# Patient Record
Sex: Female | Born: 1970 | Race: Black or African American | Hispanic: No | Marital: Single | Smoking: Never smoker
Health system: Southern US, Community
[De-identification: ages and names within clinical notes are randomized; demographics above are authoritative.]

## PROBLEM LIST (undated history)

## (undated) DIAGNOSIS — E119 Type 2 diabetes mellitus without complications: Secondary | ICD-10-CM

## (undated) DIAGNOSIS — I10 Essential (primary) hypertension: Secondary | ICD-10-CM

## (undated) DIAGNOSIS — N6001 Solitary cyst of right breast: Secondary | ICD-10-CM

## (undated) DIAGNOSIS — N281 Cyst of kidney, acquired: Secondary | ICD-10-CM

## (undated) DIAGNOSIS — R928 Other abnormal and inconclusive findings on diagnostic imaging of breast: Secondary | ICD-10-CM

## (undated) DIAGNOSIS — D259 Leiomyoma of uterus, unspecified: Secondary | ICD-10-CM

## (undated) HISTORY — PX: OTHER SURGICAL HISTORY: SHX169

---

## 2003-07-15 NOTE — Op Note (Signed)
[facility]                                OPERATION REPORT   NAM ETHELEEN, VALTIERRA   E:   MR  [id number]                         DATE:            07/15/2003   #:   Lindley Magnus  [id number]                      PT. LOCATION:   #   DAVID Genevive Bi, M.D.   cc:    Alferd Patee, M.D.   PREOPERATIVE DIAGNOSIS:   Urinary frequency.   POSTOPERATIVE DIAGNOSIS:   Interstitial cystitis.   OPERATIVE PROCEDURES:      1. Cystoscopy.      2. Hydraulic overdistention of the bladder.      3. Urethral dilatation.   SURGEON:D. Genevive Bi, M.D.   FINDINGS:   Bladder capacity of 225 cc, petechial hemorrhages throughout the bladder on   second distention and urethral stenosis.   DESCRIPTION OF PROCEDURE:  The patient was placed in the dorsal lithotomy   position, the skin of the vagina was prepped with a soap suds solution and   the patient was draped in the appropriate manner.  The urethra was   stenotic, but I was able to get the 21-French cystoscope in.  On the second   distention, she had petechial hemorrhages throughout the bladder consistent   with interstitial cystitis.  There were no bladder tumors seen.  The   ureteral orifices were in their normal location and of a normal   configuration.  Her initial bladder capacity was 225 cc.  Progressively, I   performed hydraulic overdistention of her bladder to 500 cc.  The bladder   was drained.  I dilated her urethra with Sissy Hoff sounds to 32-French.   She tolerated the procedure well.   _________________________________   Alferd Patee, M.D.   ecc  D:  07/15/2003  T:  07/16/2003  1:43 P   [id number]

## 2003-07-15 NOTE — Op Note (Signed)
[facility]                                OPERATION REPORT   NAM DANIELS, Vlada   E:   MR  [id number]                         DATE:            07/15/2003   #:   SS  [id number]                      PT. LOCATION:   #   DAVID LUSTIG, M.D.   cc:    DAVID LUSTIG, M.D.   PREOPERATIVE DIAGNOSIS:   Urinary frequency.   POSTOPERATIVE DIAGNOSIS:   Interstitial cystitis.   OPERATIVE PROCEDURES:      1. Cystoscopy.      2. Hydraulic overdistention of the bladder.      3. Urethral dilatation.   SURGEON:D. Lustig, M.D.   FINDINGS:   Bladder capacity of 225 cc, petechial hemorrhages throughout the bladder on   second distention and urethral stenosis.   DESCRIPTION OF PROCEDURE:  The patient was placed in the dorsal lithotomy   position, the skin of the vagina was prepped with a soap suds solution and   the patient was draped in the appropriate manner.  The urethra was   stenotic, but I was able to get the 21-French cystoscope in.  On the second   distention, she had petechial hemorrhages throughout the bladder consistent   with interstitial cystitis.  There were no bladder tumors seen.  The   ureteral orifices were in their normal location and of a normal   configuration.  Her initial bladder capacity was 225 cc.  Progressively, I   performed hydraulic overdistention of her bladder to 500 cc.  The bladder   was drained.  I dilated her urethra with [place] sounds to 32-French.   She tolerated the procedure well.   _________________________________   DAVID LUSTIG, M.D.   ecc  D:  07/15/2003  T:  07/16/2003  1:43 P   [id number]

## 2009-09-07 MED ORDER — METOPROLOL TARTRATE 50 MG TAB
50 mg | ORAL_TABLET | Freq: Two times a day (BID) | ORAL | Status: AC
Start: 2009-09-07 — End: 2010-09-02

## 2009-09-07 NOTE — Telephone Encounter (Signed)
Advised pt Dr Margurite Auerbach is not in ofc and will only refill for 30 days, has not been seen since Jan and needs a follow up before any more refills

## 2009-09-08 NOTE — Telephone Encounter (Signed)
Called into pharmacy

## 2009-11-08 LAB — D-DIMER, QUANTITATIVE: D-Dimer, Quant: 0.56 ug/ml(FEU) — ABNORMAL HIGH (ref <0.55)

## 2009-11-08 LAB — METABOLIC PANEL, COMPREHENSIVE
A-G Ratio: 0.9 — ABNORMAL LOW (ref 1.2–3.5)
ALT (SGPT): 27 U/L — ABNORMAL LOW (ref 39–65)
AST (SGOT): 14 U/L — ABNORMAL LOW (ref 15–37)
Albumin: 3.9 g/dL (ref 3.5–5.0)
Alk. phosphatase: 115 U/L (ref 50–136)
Anion gap: 5 mmol/L — ABNORMAL LOW (ref 7–16)
BUN: 13 MG/DL (ref 7–18)
Bilirubin, total: 0.3 MG/DL (ref 0.2–1.1)
CO2: 31 MMOL/L (ref 21–32)
Calcium: 9.1 MG/DL (ref 8.4–10.4)
Chloride: 103 MMOL/L (ref 98–107)
Creatinine: 1.1 MG/DL — ABNORMAL HIGH (ref 0.6–1.0)
GFR est AA: >60 mL/min/{1.73_m2} (ref >60)
GFR est non-AA: 58 mL/min/{1.73_m2} — ABNORMAL LOW (ref >60)
Globulin: 4.5 g/dL — ABNORMAL HIGH (ref 2.3–3.5)
Glucose: 90 MG/DL (ref 65–100)
Potassium: 3.1 MMOL/L — ABNORMAL LOW (ref 3.5–5.1)
Protein, total: 8.4 g/dL — ABNORMAL HIGH (ref 6.3–8.2)
Sodium: 139 MMOL/L (ref 136–145)

## 2009-11-08 LAB — D DIMER: D DIMER: 0.56 ug/ml(FEU) — ABNORMAL HIGH (ref <0.55)

## 2009-11-08 LAB — CBC WITH AUTOMATED DIFF
ABS. BASOPHILS: 0 10*3/uL (ref 0.0–0.2)
ABS. EOSINOPHILS: 0.1 10*3/uL (ref 0.0–0.8)
ABS. IMM. GRANS.: 0 10*3/uL (ref 0.0–2.0)
ABS. LYMPHOCYTES: 1.9 10*3/uL (ref 0.5–4.6)
ABS. MONOCYTES: 0.5 10*3/uL (ref 0.1–1.3)
ABS. NEUTROPHILS: 3.1 10*3/uL (ref 1.7–8.2)
BASOPHILS: 0 % (ref 0.0–2.0)
EOSINOPHILS: 2 % (ref 0.5–7.8)
HCT: 41.7 % (ref 37.6–48.3)
HGB: 13.6 g/dL (ref 11.7–15.0)
IMMATURE GRANULOCYTES: 0.4 % (ref 0.0–2.0)
LYMPHOCYTES: 33 % (ref 13–44)
MCH: 29.6 PG (ref 26.1–32.9)
MCHC: 32.6 g/dL (ref 31.4–35.0)
MCV: 90.8 FL (ref 79.6–97.8)
MONOCYTES: 9 % (ref 4.0–12.0)
MPV: 10.6 FL — ABNORMAL LOW (ref 10.8–14.1)
NEUTROPHILS: 56 % (ref 43–78)
PLATELET: 364 10*3/uL (ref 140–440)
RBC: 4.59 M/uL (ref 3.86–5.18)
RDW: 13.4 % (ref 11.9–14.6)
WBC: 5.7 10*3/uL (ref 4.0–10.5)

## 2009-11-08 LAB — POC CARDIAC MARKERS
CK-MB: <1 ng/mL (ref 0.0–8.0)
Myoglobin: 42 ng/mL (ref 0–170)
Troponin-I: <0.05 ng/mL (ref 0.00–0.30)

## 2009-11-08 LAB — EKG, 12 LEAD, INITIAL
Atrial Rate: 71 {beats}/min
Calculated P Axis: 68 degrees
Calculated R Axis: 3 degrees
Calculated T Axis: 48 degrees
P-R Interval: 168 ms
Q-T Interval: 388 ms
QRS Duration: 88 ms
QTC Calculation (Bezet): 421 ms
Ventricular Rate: 71 {beats}/min

## 2009-11-08 LAB — LIPID PANEL
CHOL/HDL Ratio: 4
Cholesterol, total: 139 MG/DL (ref <200)
HDL Cholesterol: 35 MG/DL — ABNORMAL LOW (ref 40–60)
LDL, calculated: 93.6 MG/DL (ref <100)
Triglyceride: 52 MG/DL (ref 35–150)
VLDL, calculated: 10.4 MG/DL (ref 6.0–23.0)

## 2009-11-08 LAB — MAGNESIUM: Magnesium: 1.9 MG/DL (ref 1.8–2.4)

## 2009-11-08 MED ORDER — IBUPROFEN 800 MG TAB
800 mg | ORAL_TABLET | Freq: Three times a day (TID) | ORAL | Status: AC | PRN
Start: 2009-11-08 — End: 2009-11-11

## 2009-11-08 MED ADMIN — ioversol (OPTIRAY) 350 mg/mL contrast solution 100 mL: INTRAVENOUS | @ 07:00:00 | NDC 00019133311

## 2009-11-08 MED ADMIN — potassium chloride (K-DUR, KLOR-CON) SR tablet 20 mEq: ORAL | @ 07:00:00 | NDC 00245005889

## 2009-11-08 MED FILL — KLOR-CON M20 MEQ TABLET,EXTENDED RELEASE: 20 mEq | ORAL | Qty: 1

## 2009-11-08 NOTE — ED Provider Notes (Signed)
Patient is a 38 y.o. female presenting with chest pain. The history is provided by the patient. No language interpreter was used.   Chest Pain (Angina)   This is a new problem. The current episode started 1 to 2 hours ago. The problem has been gradually improving. Duration of episode(s) is 45 minutes. The problem occurs rarely. The pain is associated with normal activity.        Past Medical History   Diagnosis Date   ??? Hypertension    ??? Diabetes           Past Surgical History   Procedure Date   ??? Hx breast biopsy    ??? Hx other surgical      skin graft right arm   ??? Abdomen surgery proc unlisted            No family history on file.     History   Social History   ??? Marital Status: Married     Spouse Name: N/A     Number of Children: N/A   ??? Years of Education: N/A   Occupational History   ??? Not on file.   Social History Main Topics   ??? Tobacco Use: Never   ??? Alcohol Use: No   ??? Drug Use: No   ??? Sexually Active:    Other Topics Concern   ??? Not on file   Social History Narrative   ??? No narrative on file           ALLERGIES: Demerol      Review of Systems   Cardiovascular: Positive for chest pain.       Filed Vitals:    11/08/2009  1:22 AM 11/08/2009  1:30 AM 11/08/2009  1:45 AM 11/08/2009  2:00 AM   BP: 143/90 138/86 134/89 134/83   Pulse: 76 72 69 66   Temp: 98.2 ??F (36.8 ??C)      Resp: 14 34 11 15   Height: 5\' 7"  (1.702 m)      Weight: 165 lb (74.844 kg)      SpO2: 100% 99% 100% 98%              Physical Exam   Nursing note and vitals reviewed.  Constitutional: She is oriented to person, place, and time. She appears well-developed and well-nourished.   HENT:   Head: Normocephalic and atraumatic.   Right Ear: External ear normal.   Left Ear: External ear normal.   Nose: Nose normal.   Mouth/Throat: Oropharynx is clear and moist.   Eyes: Conjunctivae are normal. Pupils are equal, round, and reactive to light.   Neck: Normal range of motion. Neck supple.    Cardiovascular: Normal rate, regular rhythm and normal heart sounds.    Pulmonary/Chest: Effort normal and breath sounds normal. No respiratory distress. She has no wheezes. She has no rales.   Abdominal: Soft. Bowel sounds are normal.   Musculoskeletal: Normal range of motion. She exhibits no edema.   Neurological: She is alert and oriented to person, place, and time.   Skin: Skin is warm and dry.   Psychiatric: She has a normal mood and affect. Her behavior is normal.        MDM Coding   Reviewed: previous chart, nursing note and vitals  Interpretation: labs, ECG, x-ray and CT scan        Procedures

## 2009-11-08 NOTE — ED Notes (Signed)
I have reviewed discharge instructions with the patient.  The patient verbalized understanding. No prescriptions given. Patient discharged ambulatory.

## 2009-11-08 NOTE — ED Notes (Signed)
CTA chest neg

## 2015-04-12 ENCOUNTER — Ambulatory Visit: Admit: 2015-04-12 | Payer: PRIVATE HEALTH INSURANCE | Attending: Obstetrics & Gynecology | Primary: Family Medicine

## 2015-04-12 ENCOUNTER — Encounter: Attending: Obstetrics & Gynecology | Primary: Family Medicine

## 2015-04-12 DIAGNOSIS — D252 Subserosal leiomyoma of uterus: Secondary | ICD-10-CM

## 2015-04-12 NOTE — Progress Notes (Signed)
SUBJECTIVE: this a 44 year old female gravida 74 para 44 who presents with intermittent pelvic pain and heavy menstrual bleeding. She states that the pain is normally associated with her menstruation it is sharp. She gets relief with Naprosyn. Her bleeding lasts for only 3-4 days but it consists of clots and she uses 8-10 pads per day. Past medical history was reviewed.[01]   OBJECTIVE:   Vital signs are stable.  Is a well-developed well-nourished female in no apparent distress.  Abdomen was slightly distended with a palpable mass 2 fingers above the pelvic brim. It was slightly tender.  External genitalia normal.  Vaginal canal revealed no blood or discharge.  Cervix had no visible lesions.  Uterus was 12-14 weeks size and irregular suggestive of fibroids.  Adnexa unremarkable02]  ASSESSMENT:  Symptomatic uterine fibroids[03]  PLAN:  1.  Patient was given treatment options between laparoscopic hysterectomy and fibroid embolization. She wants to pursue fibroid embolization and therefore an MRI of the pelvis was ordered.  2. She will be given a referral to interventional radiology.[04]

## 2015-04-12 NOTE — Patient Instructions (Signed)
Laparoscopic Hysterectomy: Before Your Surgery  What is a laparoscopic hysterectomy?     A hysterectomy is surgery to take out the uterus. In some cases, the ovaries and fallopian tubes also are taken out at the same time.  The doctor makes one or more small cuts in the belly. These cuts are called incisions. They let the doctor insert tools to do the surgery. One of these tools is a tube with a light on it. It's called a laparoscope, or scope. The scope and the other tools allow the doctor to free the uterus. The doctor then removes the uterus through the small cuts.  In a total hysterectomy, the doctor takes out the uterus and the cervix. In a supracervical hysterectomy, only the uterus is taken out.  Most women go home in 1 to 2 days. You may need about 4 to 6 weeks to fully recover.  After the surgery, you will not have periods. You will not be able to get pregnant. If there is a chance that you will want to have a baby, talk to your doctor about other treatment options.  Your doctor may advise you to take hormone pills if your ovaries are removed. Your doctor will talk to you about the risks and benefits of hormones. He or she will also tell you how long to take them.  This surgery probably won't lower your interest in sex. In fact, some women enjoy sex more. This may be because they no longer have to worry about birth control or heavy bleeding.  Follow-up care is a key part of your treatment and safety. Be sure to make and go to all appointments, and call your doctor if you are having problems. It's also a good idea to know your test results and keep a list of the medicines you take.  What happens before surgery?  Surgery can be stressful. This information will help you understand what you can expect. And it will help you safely prepare for surgery.  Preparing for surgery  ?? Understand exactly what surgery is planned, along with the risks, benefits, and other options.   ?? Tell your doctors ALL the medicines, vitamins, supplements, and herbal remedies you take. Some of these can increase the risk of bleeding or interact with anesthesia.  ?? If you take blood thinners, such as warfarin (Coumadin), clopidogrel (Plavix), or aspirin, be sure to talk to your doctor. He or she will tell you if you should stop taking these medicines before your surgery. Make sure that you understand exactly what your doctor wants you to do.  ?? Your doctor will tell you which medicines to take or stop before your surgery. You may need to stop taking certain medicines a week or more before surgery. So talk to your doctor as soon as you can.  ?? If you have an advance directive, let your doctor know. It may include a living will and a durable power of attorney for health care. Bring a copy to the hospital. If you don't have one, you may want to prepare one. It lets your doctor and loved ones know your health care wishes. Doctors advise that everyone prepare these papers before any type of surgery or procedure.  What happens on the day of surgery?  ?? Follow the instructions exactly about when to stop eating and drinking. If you don't, your surgery may be canceled. If your doctor told you to take your medicines on the day of surgery, take them with only a  sip of water.  ?? Take a bath or shower before you come in for your surgery. Do not apply lotions, perfumes, deodorants, or nail polish.  ?? Do not shave the surgical site yourself.  ?? Take off all jewelry and piercings. And take out contact lenses, if you wear them.  At the hospital or surgery center  ?? Bring a picture ID.  ?? The area for surgery is often marked to make sure there are no errors.  ?? You will be kept comfortable and safe by your anesthesia provider. You will be asleep during the surgery.  ?? The surgery will take about 2 to 4 hours.  Going home  ?? Be sure you have someone to drive you home. Anesthesia and pain medicine  make it unsafe for you to drive.  ?? You will be given more specific instructions about recovering from your surgery. They will cover things like diet, wound care, follow-up care, driving, and getting back to your normal routine.  When should you call your doctor?  ?? You have questions or concerns.  ?? You don't understand how to prepare for your surgery.  ?? You become ill before the surgery (such as fever, flu, or a cold).  ?? You need to reschedule or have changed your mind about having the surgery.   Where can you learn more?   Go to [email or web address]  Enter D130 in the search box to learn more about "Laparoscopic Hysterectomy: Before Your Surgery."   ?? 2006-2016 [employer]. Care instructions adapted under license by [employer] (which disclaims liability or warranty for this information). This care instruction is for use with your licensed healthcare professional. If you have questions about a medical condition or this instruction, always ask your healthcare professional. [place] any warranty or liability for your use of this information.  Content Version: 10.8.513193; Current as of: July 12, 2014

## 2015-04-21 ENCOUNTER — Encounter

## 2015-04-22 ENCOUNTER — Inpatient Hospital Stay: Admit: 2015-04-22 | Payer: PRIVATE HEALTH INSURANCE | Attending: Obstetrics & Gynecology | Primary: Family Medicine

## 2015-04-22 DIAGNOSIS — N852 Hypertrophy of uterus: Secondary | ICD-10-CM

## 2015-04-22 LAB — CREATININE, POC
Creatinine, POC: 0.9 MG/DL (ref 0.6–1.3)
GFRAA, POC: >60 mL/min/{1.73_m2} (ref >60)
GFRNA, POC: >60 mL/min/{1.73_m2} (ref >60)

## 2015-04-22 MED ORDER — GADOPENTETATE DIMEGLUMINE 10 MMOL/20 ML (469.01 MG/ML) IV
10 mmol/20 mL (469.01 mg/mL) | Freq: Once | INTRAVENOUS | Status: AC
Start: 2015-04-22 — End: 2015-04-22
  Administered 2015-04-22: 22:00:00 via INTRAVENOUS

## 2015-04-22 MED FILL — MAGNEVIST 10 MMOL/20 ML (469.01 MG/ML) INTRAVENOUS SOLUTION: 10 mmol/20 mL (469.01 mg/mL) | INTRAVENOUS | Qty: 16

## 2015-04-25 ENCOUNTER — Inpatient Hospital Stay: Attending: Family Medicine | Primary: Family Medicine

## 2015-04-25 DIAGNOSIS — R928 Other abnormal and inconclusive findings on diagnostic imaging of breast: Secondary | ICD-10-CM

## 2015-05-04 NOTE — Telephone Encounter (Signed)
Patient called requesting MRI results and her next step for surgery. Please advise.

## 2015-05-05 ENCOUNTER — Ambulatory Visit
Admit: 2015-05-05 | Discharge: 2015-05-05 | Payer: PRIVATE HEALTH INSURANCE | Attending: Urology | Primary: Family Medicine

## 2015-05-05 ENCOUNTER — Encounter

## 2015-05-05 DIAGNOSIS — N281 Cyst of kidney, acquired: Secondary | ICD-10-CM

## 2015-05-05 LAB — AMB POC URINALYSIS DIP STICK AUTO W/O MICRO
Bilirubin (UA POC): NEGATIVE
Blood (UA POC): NEGATIVE
Glucose (UA POC): NEGATIVE
Ketones (UA POC): NEGATIVE
Leukocyte esterase (UA POC): NEGATIVE
Nitrites (UA POC): NEGATIVE
Protein (UA POC): NEGATIVE mg/dL
Specific gravity (UA POC): 1.02 (ref 1.001–1.035)
Urobilinogen (UA POC): 0.2 (ref 0.2–1)
pH (UA POC): 8 (ref 4.6–8.0)

## 2015-05-05 NOTE — Progress Notes (Signed)
Chief Complaint   Patient presents with   ??? Renal Cyst         HPI:         April Perry is a 44 y.o. female who was referred by Georgianne Fick, MD for evaluation of Renal cyst.      The patient came to see [place] for the first time on 05/05/15 traveling from the [place] to [place] for evaluation of a renal cyst apparently seen on MRI.  However, the only MRI in the EMR system was a pelvic MRI from early May 2016 which did not image the kidneys at all.  Although the person scheduling appointment stated that the MRI recently done at [facility] was somehow related to the cyst, the patient then stated that she was found to have a cyst while being evaluated in [place] and she did not have any films are records of that.      She states that around October 2015, she was being evaluated with an ultrasound for fibroids in half and to look up in the kidney area and mentioned a possible 7 cm right renal cyst.  Despite having no symptoms, and without being told that his any specific concerning abnormality, she was told that "it would probably have to be draining".  However, she then moved to [place], did not have access to any old records, could not supply old imaging studies, and scheduled an appointment with [place] to discuss her possible renal cyst.    She denies any hematuria or recurrent bladder or kidney infections.  She's not sure, but she may have been told once that she had a small kidney stone.  Years ago, after laparoscopy, she had urinary frequency and was told she had a "small bladder", and was put on Detrol.  Over time, with "bladder training" the medication was stopped.  However, in May 2016 she still has some complaints of urinary frequency but primarily exacerbated by diuretic for blood pressure control, and possible fibroids.          Past Medical History   Diagnosis Date   ??? Hypertension    ??? Diabetes ([place])    ??? Genital herpes    ??? Ovarian cyst    ??? Obesity    ??? Chronic pain       left arm, chest and back       Past Surgical History   Procedure Laterality Date   ??? Hx other surgical       skin graft right arm   ??? Pr abdomen surgery proc unlisted     ??? Hx breast biopsy     ??? Hx tubal ligation       bilateral        Family History   Problem Relation Age of Onset   ??? Hypertension Mother    ??? Thyroid Disease Mother    ??? Cancer Father      prostate   ??? Hypertension Maternal Grandmother    ??? Diabetes Maternal Grandmother    ??? Cancer Paternal Grandfather      lung       Current Outpatient Prescriptions   Medication Sig Dispense Refill   ??? METOPROLOL SUCCINATE PO Take  by mouth.     ??? hydrochlorothiazide (HYDRODIURIL) 25 mg tablet Take 25 mg by mouth daily.         History     Social History   ??? Marital Status: SINGLE     Spouse Name: N/A   ???  Number of Children: N/A   ??? Years of Education: N/A     Occupational History   ??? Not on file.     Social History Main Topics   ??? Smoking status: Never Smoker    ??? Smokeless tobacco: Never Used   ??? Alcohol Use: No   ??? Drug Use: No   ??? Sexual Activity: Not on file     Other Topics Concern   ??? Not on file     Social History Narrative        Allergies:  Allergies   Allergen Reactions   ??? Demerol [Meperidine] Rash   ??? Lisinopril Unable to Obtain         ROS:    Constitutional: Fever: No  Skin: Rash: No  HEENT: Hearing difficulty: No  Eyes: Blurred vision: No  Cardiovascular: Chest pain: No  Respiratory: Shortness of breath: No  Gastrointestinal: Nausea/vomiting: No  Musculoskeletal: Back pain: No  Neurological: Weakness: No  Psychological: Memory loss: No  Comments/additional findings:   All other systems reviewed and are negative      PE:    BP 130/80 mmHg   Ht 5\' 7"  (1.702 m)   Wt 169 lb (76.658 kg)   BMI 26.46 kg/m2  Constitutional: Well developed, well-nourished female in no acute distress.   CV:  No peripheral swelling noted  Respiratory: No respiratory distress or difficulties  Abdomen:  Soft and nontender. No masses.    GU Female:  No CVA tenderness.     Skin: No bruising or rashes.  No petechia.    Neuro/Psych:  Patient with appropriate affect.  Alert and oriented.            LAB:    Results for orders placed or performed in visit on 05/05/15   AMB POC URINALYSIS DIP STICK AUTO W/O MICRO   Result Value Ref Range    Color (UA POC) Yellow     Clarity (UA POC) Clear     Glucose (UA POC) Negative Negative    Bilirubin (UA POC) Negative Negative    Ketones (UA POC) Negative Negative    Specific gravity (UA POC) 1.020 1.001 - 1.035    Blood (UA POC) Negative Negative    pH (UA POC) 8.0 4.6 - 8.0    Protein (UA POC) Negative Negative mg/dL    Urobilinogen (UA POC) 0.2 mg/dL 0.2 - 1    Nitrites (UA POC) Negative Negative    Leukocyte esterase (UA POC) Negative Negative                IMP:      Encounter Diagnosis   Name Primary?   ??? Renal cyst Yes                 SUMMARY STATEMENT AND PLAN:    If she truly has a 7 cm renal cyst on the right, and it is benign-appearing, we would not do anything about that.  She is asymptomatic and the size of the cyst is irrelevant regarding the need for further investigation or drainage.    She will get an ultrasound in [place] to evaluate her kidneys, and if all we find his simple cysts, I would have her return as needed.  No specific follow-up would be needed for those.  If there is some concern, we may need further imaging such as CT or MRI.  She will call for the ultrasound when she is done and will return if there is an issue.  PLEASE NOTE:   This document has been produced using voice recognition software. Unrecognized errors in transcription may be present    Siri Cole, MD, F.A.C.S.

## 2015-05-06 ENCOUNTER — Encounter

## 2015-05-06 ENCOUNTER — Inpatient Hospital Stay: Admit: 2015-05-06 | Payer: Self-pay | Attending: Family Medicine | Primary: Family Medicine

## 2015-05-06 DIAGNOSIS — R928 Other abnormal and inconclusive findings on diagnostic imaging of breast: Secondary | ICD-10-CM

## 2015-05-09 ENCOUNTER — Inpatient Hospital Stay: Admit: 2015-05-09 | Payer: Self-pay | Attending: Urology | Primary: Family Medicine

## 2015-05-09 DIAGNOSIS — N281 Cyst of kidney, acquired: Secondary | ICD-10-CM

## 2015-05-10 ENCOUNTER — Encounter

## 2015-05-10 NOTE — Progress Notes (Signed)
Quick Note:        Please advise the patient that the ultrasound confirms a right renal cyst. They also saw what they thought was a tiny other cyst next to it, but it was too small to accurately assess. They recommended a follow-up study just to make sure everything looked okay over time. I would recommend a repeat ultrasound in a [place] facility in about 6 months and she should return to [facility] [place] after that. Because she came from [place] to [place], that Follow-up appointment can be scheduled either in our [place] or [place] office for her convenience if she desires.    ______

## 2015-05-11 NOTE — Telephone Encounter (Addendum)
Telephone call to patient, left a message for patient to return phone call  Fredric Dine Tamila Gaulin        ----- Message from Siri Cole, MD sent at 05/10/2015  7:29 AM EDT -----  Please advise the patient that the ultrasound confirms a right renal cyst.  They also saw what they thought was a tiny other cyst next to it, but it was too small to accurately assess.  They recommended a follow-up study just to make sure everything looked okay over time.  I would recommend a repeat ultrasound in a [place] facility in about 6 months and she should return to [facility] [place] after that.  Because she came from [place] to [place], that Follow-up appointment can be scheduled either in our [place] or [place] office for her convenience if she desires.

## 2015-05-12 NOTE — Telephone Encounter (Signed)
Patient returned phone call and ultrasound results were given to her. Patient stated shw would follow up with an appointment and renal US in six months.      Gilford Raid

## 2015-06-28 ENCOUNTER — Inpatient Hospital Stay
Admit: 2015-06-28 | Discharge: 2015-06-28 | Disposition: A | Discharge status: Home / Self Care | Payer: BLUE CROSS/BLUE SHIELD | Attending: Emergency Medicine

## 2015-06-28 DIAGNOSIS — R0789 Other chest pain: Secondary | ICD-10-CM

## 2015-06-28 LAB — METABOLIC PANEL, BASIC
Anion gap: 6 mmol/L (ref 3.0–18)
BUN/Creatinine ratio: 13 (ref 12–20)
BUN: 11 MG/DL (ref 7.0–18)
CO2: 29 mmol/L (ref 21–32)
Calcium: 8.6 MG/DL (ref 8.5–10.1)
Chloride: 106 mmol/L (ref 100–108)
Creatinine: 0.84 MG/DL (ref 0.6–1.3)
GFR est AA: >60 mL/min/{1.73_m2} (ref >60)
GFR est non-AA: >60 mL/min/{1.73_m2} (ref >60)
Glucose: 94 mg/dL (ref 74–99)
Potassium: 3.6 mmol/L (ref 3.5–5.5)
Sodium: 141 mmol/L (ref 136–145)

## 2015-06-28 LAB — CBC WITH AUTOMATED DIFF
ABS. BASOPHILS: 0 10*3/uL (ref 0.0–0.06)
ABS. EOSINOPHILS: 0.2 10*3/uL (ref 0.0–0.4)
ABS. LYMPHOCYTES: 1.5 10*3/uL (ref 0.9–3.6)
ABS. MONOCYTES: 0.5 10*3/uL (ref 0.05–1.2)
ABS. NEUTROPHILS: 3.1 10*3/uL (ref 1.8–8.0)
BASOPHILS: 0 % (ref 0–2)
EOSINOPHILS: 4 % (ref 0–5)
HCT: 38.8 % (ref 35.0–45.0)
HGB: 11.9 g/dL — ABNORMAL LOW (ref 12.0–16.0)
LYMPHOCYTES: 29 % (ref 21–52)
MCH: 25.3 PG (ref 24.0–34.0)
MCHC: 30.7 g/dL — ABNORMAL LOW (ref 31.0–37.0)
MCV: 82.6 FL (ref 74.0–97.0)
MONOCYTES: 9 % (ref 3–10)
MPV: 10.7 FL (ref 9.2–11.8)
NEUTROPHILS: 58 % (ref 40–73)
PLATELET COMMENTS: ADEQUATE
PLATELET: 434 10*3/uL — ABNORMAL HIGH (ref 135–420)
RBC: 4.7 M/uL (ref 4.20–5.30)
RDW: 25.1 % — ABNORMAL HIGH (ref 11.6–14.5)
WBC: 5.3 10*3/uL (ref 4.6–13.2)

## 2015-06-28 LAB — EKG, 12 LEAD, INITIAL
Atrial Rate: 74 {beats}/min
Calculated P Axis: 60 degrees
Calculated R Axis: -16 degrees
Calculated T Axis: 52 degrees
Diagnosis: NORMAL
P-R Interval: 150 ms
Q-T Interval: 398 ms
QRS Duration: 88 ms
QTC Calculation (Bezet): 441 ms
Ventricular Rate: 74 {beats}/min

## 2015-06-28 LAB — TROPONIN I: Troponin-I, QT: <0.02 NG/ML (ref 0.00–0.06)

## 2015-06-28 MED ORDER — PANTOPRAZOLE 40 MG TAB, DELAYED RELEASE
40 mg | ORAL_TABLET | Freq: Every day | ORAL | Status: AC
Start: 2015-06-28 — End: 2015-07-18

## 2015-06-28 MED ORDER — ACETAMINOPHEN 325 MG TABLET
325 mg | Freq: Once | ORAL | Status: AC
Start: 2015-06-28 — End: 2015-06-28
  Administered 2015-06-28: 19:00:00 via ORAL

## 2015-06-28 MED ORDER — ALUMINUM-MAGNESIUM HYDROXIDE 200 MG-200 MG/5 ML ORAL SUSP
200-2005 mg/5 mL | Freq: Once | ORAL | Status: AC
Start: 2015-06-28 — End: 2015-06-28
  Administered 2015-06-28: 19:00:00 via ORAL

## 2015-06-28 MED ORDER — LIDOCAINE 2 % MUCOSAL SOLN
2 % | Status: DC | PRN
Start: 2015-06-28 — End: 2015-06-28

## 2015-06-28 MED FILL — LIDOCAINE VISCOUS 2 % MUCOSAL SOLUTION: 2 % | Qty: 15

## 2015-06-28 MED FILL — DONNATAL 16.2 MG-0.1037 MG/5 ML (5 ML) ORAL ELIXIR: 16.2 mg-0.1037 mg/5 mL (5 mL) | ORAL | Qty: 10

## 2015-06-28 MED FILL — TYLENOL 325 MG TABLET: 325 mg | ORAL | Qty: 2

## 2015-06-28 NOTE — ED Notes (Signed)
April Perry is a 44 y.o. female that was discharged in stable. Pt was accompanied by friend. Pt is not driving. The patients diagnosis, condition and treatment were explained to  patient and aftercare instructions were given.  The patient verbalized understanding. Patient armband removed and shredded.

## 2015-06-28 NOTE — ED Notes (Signed)
Pt c/o midsternal cp that started 30 mins ago. Pt reports "I have uterine fibroids and when I menstruate I lose a lot of blood and they told me if I get chest pain to come in because I may need blood" pt denies fever, n/v/d or sob

## 2015-06-28 NOTE — ED Provider Notes (Addendum)
HPI Comments: 2:02 PM April Perry is a 44 y.o. female with a history of anemia, uterine fibroids, HTN, diabetes, and chronic pain who presents to ED c/o non-radiating, midsternal CP onset 45 mins ago. Pt is not experiencing pain currently but explains the pain felt like a "heaviness". Pt states "I have uterine fibroids and when I menstruate I lose a lot of blood and they told me if I get chest pain to come in because I may need blood". Pt reports 2 iron infusions last month for anemia. Pt is planning to be scheduled for a surgery to have "particles placed in my veins to block the fibroids".  Pt denies fever, NVD, or SOB. No other concerns at this time.       PCP: Georgianne Fick, MD        Patient is a 44 y.o. female presenting with chest pain.   Chest Pain (Angina)   Pertinent negatives include no fever, no nausea, no shortness of breath and no vomiting.        Past Medical History:   Diagnosis Date   ??? Hypertension    ??? Diabetes ([place])    ??? Genital herpes    ??? Ovarian cyst    ??? Obesity    ??? Chronic pain      left arm, chest and back       Past Surgical History:   Procedure Laterality Date   ??? Hx other surgical       skin graft right arm   ??? Pr abdomen surgery proc unlisted     ??? Hx breast biopsy     ??? Hx tubal ligation       bilateral         Family History:   Problem Relation Age of Onset   ??? Hypertension Mother    ??? Thyroid Disease Mother    ??? Cancer Father      prostate   ??? Hypertension Maternal Grandmother    ??? Diabetes Maternal Grandmother    ??? Cancer Paternal Grandfather      lung       History     Social History   ??? Marital Status: MARRIED     Spouse Name: N/A   ??? Number of Children: N/A   ??? Years of Education: N/A     Occupational History   ??? Not on file.     Social History Main Topics   ??? Smoking status: Never Smoker    ??? Smokeless tobacco: Never Used   ??? Alcohol Use: No   ??? Drug Use: No   ??? Sexual Activity: Not on file     Other Topics Concern   ??? Not on file     Social History Narrative          ALLERGIES: Demerol and Lisinopril    Review of Systems   Constitutional: Negative for fever.   Respiratory: Negative for shortness of breath.    Cardiovascular: Positive for chest pain.   Gastrointestinal: Negative for nausea, vomiting and diarrhea.       Filed Vitals:    06/28/15 1357   BP: 164/88   Pulse: 69   Temp: 98.5 ??F (36.9 ??C)   Resp: 12   Height: 5\' 7"  (1.702 m)   Weight: 74.844 kg (165 lb)   SpO2: 99%            Physical Exam   Constitutional: She is oriented to person, place, and time. She appears  well-developed and well-nourished. No distress.   HENT:   Head: Normocephalic.   Mouth/Throat: No oropharyngeal exudate.   Eyes: Pupils are equal, round, and reactive to light. Right eye exhibits no discharge.   Neck: Normal range of motion. No JVD present. No thyromegaly present.   Cardiovascular: Normal rate and regular rhythm.  Exam reveals no gallop and no friction rub.    No murmur heard.  Pulmonary/Chest: Effort normal and breath sounds normal. No respiratory distress. She has no wheezes. She has no rales. She exhibits no tenderness.   Abdominal: Soft. Bowel sounds are normal. She exhibits no distension. There is no tenderness. There is no rebound and no guarding.   Musculoskeletal: Normal range of motion.   Neurological: She is alert and oriented to person, place, and time.   Skin: Skin is warm. No erythema.   Psychiatric: She has a normal mood and affect.   Nursing note and vitals reviewed.       MDM  Number of Diagnoses or Management Options  Atypical chest pain:   Diagnosis management comments: Pt feels "much better" after meds, agrees with dispo and F/U plan.  Chelsea Aus, MD  3:39 PM         Amount and/or Complexity of Data Reviewed  Clinical lab tests: ordered and reviewed        Procedures    Vitals:  Patient Vitals for the past 12 hrs:   Temp Pulse Resp BP SpO2   06/28/15 1357 98.5 ??F (36.9 ??C) 69 12 164/88 mmHg 99 %     99% on RA, indicating adequate oxygenation.      Medications ordered:   Medications - No data to display      Lab findings:  Recent Results (from the past 12 hour(s))   EKG, 12 LEAD, INITIAL    Collection Time: 06/28/15  1:57 PM   Result Value Ref Range    Ventricular Rate 74 BPM    Atrial Rate 74 BPM    P-R Interval 150 ms    QRS Duration 88 ms    Q-T Interval 398 ms    QTC Calculation (Bezet) 441 ms    Calculated P Axis 60 degrees    Calculated R Axis -16 degrees    Calculated T Axis 52 degrees    Diagnosis       Normal sinus rhythm  ST abnormality, possible digitalis effect  Abnormal ECG  No previous ECGs available         EKG interpretation by ED Physician:NSR at 74 with no acute changes, Chelsea Aus, MD        X-Ray, CT or other radiology findings or impressions:  No orders to display       Progress notes, Consult notes or additional Procedure notes:       Reevaluation of patient:       Disposition:  Diagnosis: No diagnosis found.    Disposition:     Follow-up Information     None           Patient's Medications   Start Taking    No medications on file   Continue Taking    METOPROLOL SUCCINATE PO    Take  by mouth.    SPIRONOLACTONE (ALDACTONE) 50 MG TABLET    Take 50 mg by mouth daily.   These Medications have changed    No medications on file   Stop Taking    HYDROCHLOROTHIAZIDE (HYDRODIURIL) 25 MG TABLET    Take  25 mg by mouth daily.       Scribe Attestation:     I, Marciano Sequin, scribing for and in the presence of  Dr.Mellody Masri Kathy Breach, MD June 28, 2015 at 2:02 PM     Physician Attestation:   I personally performed the services described in this documentation, reviewed and edited the documentation which was dictated to the scribe in my presence, and it accurately records my words and actions. Chelsea Aus, MD  June 28, 2015

## 2015-06-29 ENCOUNTER — Encounter

## 2015-07-02 ENCOUNTER — Emergency Department: Admit: 2015-07-02 | Payer: Self-pay | Primary: Family Medicine

## 2015-07-02 ENCOUNTER — Inpatient Hospital Stay
Admit: 2015-07-02 | Discharge: 2015-07-03 | Disposition: A | Discharge status: Home / Self Care | Payer: Self-pay | Attending: Emergency Medicine

## 2015-07-02 DIAGNOSIS — R1084 Generalized abdominal pain: Secondary | ICD-10-CM

## 2015-07-02 MED ORDER — SODIUM CHLORIDE 0.9% BOLUS IV
0.9 % | Freq: Once | INTRAVENOUS | Status: AC
Start: 2015-07-02 — End: 2015-07-02
  Administered 2015-07-02: via INTRAVENOUS

## 2015-07-02 MED ORDER — ONDANSETRON (PF) 4 MG/2 ML INJECTION
4 mg/2 mL | INTRAMUSCULAR | Status: AC
Start: 2015-07-02 — End: 2015-07-02
  Administered 2015-07-03: via INTRAVENOUS

## 2015-07-02 MED ORDER — HYDROMORPHONE (PF) 1 MG/ML IJ SOLN
1 mg/mL | Freq: Once | INTRAMUSCULAR | Status: AC
Start: 2015-07-02 — End: 2015-07-02
  Administered 2015-07-03: via INTRAVENOUS

## 2015-07-02 MED FILL — SODIUM CHLORIDE 0.9 % IV: INTRAVENOUS | Qty: 1000

## 2015-07-02 MED FILL — ONDANSETRON (PF) 4 MG/2 ML INJECTION: 4 mg/2 mL | INTRAMUSCULAR | Qty: 2

## 2015-07-02 NOTE — ED Notes (Signed)
Pt states tried gas pills with pain onset with no relief.

## 2015-07-02 NOTE — ED Provider Notes (Signed)
HPI Comments: 7:41 PM April Perry is a 44 y.o. female who presents to ED c/o R sided abd pain onset 2 hours ago. Pt explains the pain began suddenly while she was driving. Pt then went home and laid down but the pain continued. Pt's last normal BM was yesterday and states she is typically constipated. Pt denies nausea or vomiting. No other concerns at this time.           The history is provided by the patient.        Past Medical History:   Diagnosis Date   ??? Hypertension    ??? Diabetes ([place])    ??? Genital herpes    ??? Ovarian cyst    ??? Obesity    ??? Chronic pain      left arm, chest and back       Past Surgical History:   Procedure Laterality Date   ??? Hx other surgical       skin graft right arm   ??? Pr abdomen surgery proc unlisted     ??? Hx breast biopsy     ??? Hx tubal ligation       bilateral         Family History:   Problem Relation Age of Onset   ??? Hypertension Mother    ??? Thyroid Disease Mother    ??? Cancer Father      prostate   ??? Hypertension Maternal Grandmother    ??? Diabetes Maternal Grandmother    ??? Cancer Paternal Grandfather      lung       History     Social History   ??? Marital Status: MARRIED     Spouse Name: N/A   ??? Number of Children: N/A   ??? Years of Education: N/A     Occupational History   ??? Not on file.     Social History Main Topics   ??? Smoking status: Never Smoker    ??? Smokeless tobacco: Never Used   ??? Alcohol Use: No   ??? Drug Use: No   ??? Sexual Activity: Not on file     Other Topics Concern   ??? Not on file     Social History Narrative         ALLERGIES: Demerol and Lisinopril    Review of Systems   Constitutional: Negative for fever, chills and appetite change.   HENT: Negative for congestion, sinus pressure and trouble swallowing.    Eyes: Negative for pain and visual disturbance.   Respiratory: Negative for cough, chest tightness, shortness of breath and wheezing.    Cardiovascular: Negative.  Negative for chest pain, palpitations and leg swelling.    Gastrointestinal: Positive for abdominal pain (R). Negative for nausea and vomiting.   Endocrine: Negative.    Genitourinary: Negative.    Musculoskeletal: Negative for myalgias, back pain, arthralgias and neck pain.   Skin: Negative.    Allergic/Immunologic: Negative.    Neurological: Negative for dizziness, syncope, numbness and headaches.   Hematological: Negative.    Psychiatric/Behavioral: Negative.    All other systems reviewed and are negative.      Filed Vitals:    07/02/15 1923   BP: 202/101   Pulse: 81   Temp: 97.6 ??F (36.4 ??C)   Resp: 16   Height: $Remove'5\' 7"'[id number]$  (1.702 m)   Weight: 74.844 kg (165 lb)   SpO2: 99%            Physical Exam  Constitutional: She is oriented to person, place, and time. She appears well-developed and well-nourished. No distress.   Appears to be in moderate pain.   HENT:   Head: Normocephalic and atraumatic.   Right Ear: External ear normal.   Left Ear: External ear normal.   Mouth/Throat: Oropharynx is clear and moist.   Eyes: Conjunctivae and EOM are normal. Pupils are equal, round, and reactive to light.   Neck: Normal range of motion. Neck supple. No JVD present. No tracheal deviation present. No thyromegaly present.   Cardiovascular: Normal rate, regular rhythm and normal heart sounds.  Exam reveals no gallop and no friction rub.    No murmur heard.  Pulmonary/Chest: Effort normal and breath sounds normal. No stridor. No respiratory distress. She has no wheezes. She has no rales. She exhibits no tenderness.   Abdominal: Soft. Bowel sounds are normal. She exhibits no distension. There is no tenderness. There is no rebound.   Abd soft. Could not elicit any tenderness.    Musculoskeletal: Normal range of motion. She exhibits no edema or tenderness.   Lymphadenopathy:     She has no cervical adenopathy.   Neurological: She is alert and oriented to person, place, and time. No cranial nerve deficit.   Skin: Skin is warm. She is not diaphoretic.    Psychiatric: She has a normal mood and affect. Her behavior is normal. Judgment and thought content normal.   Nursing note and vitals reviewed.       MDM    Procedures    Pulse Oximetry:   99% RA, wnl    Medications ordered:       Medications   sodium chloride 0.9 % bolus infusion 1,000 mL (0 mL IntraVENous IV Completed 07/02/15 2100)   HYDROmorphone (PF) (DILAUDID) injection 0.5 mg (0.5 mg IntraVENous Given 07/02/15 2003)   ondansetron (ZOFRAN) injection 4 mg (4 mg IntraVENous Given 07/02/15 2003)   iopamidol (ISOVUE 300) 61 % contrast injection 50-100 mL (94 mL IntraVENous Given 07/02/15 2120)         Lab findings:    Recent Results (from the past 12 hour(s))   URINALYSIS W/ RFLX MICROSCOPIC    Collection Time: 07/02/15  7:45 PM   Result Value Ref Range    Color YELLOW      Appearance CLEAR      Specific gravity 1.023 1.005 - 1.030      pH (UA) 6.0 5.0 - 8.0      Protein NEGATIVE  NEG mg/dL    Glucose NEGATIVE  NEG mg/dL    Ketone NEGATIVE  NEG mg/dL    Bilirubin NEGATIVE  NEG      Blood NEGATIVE  NEG      Urobilinogen 1.0 0.2 - 1.0 EU/dL    Nitrites NEGATIVE  NEG      Leukocyte Esterase NEGATIVE  NEG     HCG URINE, QL    Collection Time: 07/02/15  7:45 PM   Result Value Ref Range    HCG urine, Ql. NEGATIVE  NEG     CBC WITH AUTOMATED DIFF    Collection Time: 07/02/15  8:20 PM   Result Value Ref Range    WBC 5.4 4.6 - 13.2 K/uL    RBC 4.00 (L) 4.20 - 5.30 M/uL    HGB 10.3 (L) 12.0 - 16.0 g/dL    HCT 33.3 (L) 35.0 - 45.0 %    MCV 83.3 74.0 - 97.0 FL    MCH 25.8 24.0 - 34.0 PG  MCHC 30.9 (L) 31.0 - 37.0 g/dL    RDW 23.1 (H) 29.0 - 14.5 %    PLATELET 413 135 - 420 K/uL    MPV 10.6 9.2 - 11.8 FL    NEUTROPHILS 56 40 - 73 %    LYMPHOCYTES 31 21 - 52 %    MONOCYTES 10 3 - 10 %    EOSINOPHILS 3 0 - 5 %    BASOPHILS 0 0 - 2 %    ABS. NEUTROPHILS 3.0 1.8 - 8.0 K/UL    ABS. LYMPHOCYTES 1.7 0.9 - 3.6 K/UL    ABS. MONOCYTES 0.5 0.05 - 1.2 K/UL    ABS. EOSINOPHILS 0.2 0.0 - 0.4 K/UL    ABS. BASOPHILS 0.0 0.0 - 0.06 K/UL     DF AUTOMATED      RBC COMMENTS ANISOCYTOSIS  2+        RBC COMMENTS POIKILOCYTOSIS  1+        RBC COMMENTS POLYCHROMASIA  1+        RBC COMMENTS HYPOCHROMIA  1+        RBC COMMENTS TEARDROP CELLS  1+        RBC COMMENTS OVALOCYTES  1+       LIPASE    Collection Time: 07/02/15  8:20 PM   Result Value Ref Range    Lipase 247 73 - 393 U/L   METABOLIC PANEL, COMPREHENSIVE    Collection Time: 07/02/15  8:20 PM   Result Value Ref Range    Sodium 138 136 - 145 mmol/L    Potassium 3.8 3.5 - 5.5 mmol/L    Chloride 105 100 - 108 mmol/L    CO2 28 21 - 32 mmol/L    Anion gap 5 3.0 - 18 mmol/L    Glucose 123 (H) 74 - 99 mg/dL    BUN 8 7.0 - 18 MG/DL    Creatinine 6.46 0.6 - 1.3 MG/DL    BUN/Creatinine ratio 8 (L) 12 - 20      GFR est AA >60 >60 ml/min/1.97m2    GFR est non-AA >60 >60 ml/min/1.33m2    Calcium 8.5 8.5 - 10.1 MG/DL    Bilirubin, total 0.1 (L) 0.2 - 1.0 MG/DL    ALT 13 13 - 56 U/L    AST 9 (L) 15 - 37 U/L    Alk. phosphatase 88 45 - 117 U/L    Protein, total 7.3 6.4 - 8.2 g/dL    Albumin 3.4 3.4 - 5.0 g/dL    Globulin 3.9 2.0 - 4.0 g/dL    A-G Ratio 0.9 0.8 - 1.7         EKG interpretation:        X-Ray, CT or other radiology findings or impressions:        CT ABD PELV W CONT   Final Result      no acute process    Consult notes or additional Procedure notes:       Reevaluation of patient:       Rechecked patient. The patient is resting comfortably on the ED cart with no new complaints vocalized. Upon reevaluation, patient reports feeling better. I informed the patient on all ED findings, my clinical impression, and plan for tx and followup. The patient verbalized understanding and agreement with the plan. All questions answered.    Diagnosis:   1. Abdominal pain, generalized    2. Constipation, unspecified constipation type          Discharge/Disposition:  Patient was discharged in stable condition.     I have given the patient instructions regarding her diagnoses,  expectations, follow up, and return to the ER precautions. I explained to the patient that emergent conditions may arise and to return to the ER for new, worsening or any persistent conditions. I've explained the importance of following up with her doctor --Georgianne Fick, MD-- as instructed. The patient verbalized understanding of the discharge instructions and plan.    Scribe Attestation:   I, Archie Patten, scribing for and in the presence of Marya Landry, MD  July 02, 2015 at 7:41 PM     Physician Attestation:   I personally performed the services described in this documentation, reviewed and edited the documentation which was dictated to the scribe in my presence, and it accurately records my words and actions. Marya Landry, MD  July 02, 2015 at 7:41 PM

## 2015-07-02 NOTE — ED Notes (Signed)
Pt resting waiting for CT results.

## 2015-07-02 NOTE — ED Notes (Signed)
C/O right sided abdominal pain x 2 hours

## 2015-07-03 LAB — CBC WITH AUTOMATED DIFF
ABS. BASOPHILS: 0 10*3/uL (ref 0.0–0.06)
ABS. EOSINOPHILS: 0.2 10*3/uL (ref 0.0–0.4)
ABS. LYMPHOCYTES: 1.7 10*3/uL (ref 0.9–3.6)
ABS. MONOCYTES: 0.5 10*3/uL (ref 0.05–1.2)
ABS. NEUTROPHILS: 3 10*3/uL (ref 1.8–8.0)
BASOPHILS: 0 % (ref 0–2)
EOSINOPHILS: 3 % (ref 0–5)
HCT: 33.3 % — ABNORMAL LOW (ref 35.0–45.0)
HGB: 10.3 g/dL — ABNORMAL LOW (ref 12.0–16.0)
LYMPHOCYTES: 31 % (ref 21–52)
MCH: 25.8 PG (ref 24.0–34.0)
MCHC: 30.9 g/dL — ABNORMAL LOW (ref 31.0–37.0)
MCV: 83.3 FL (ref 74.0–97.0)
MONOCYTES: 10 % (ref 3–10)
MPV: 10.6 FL (ref 9.2–11.8)
NEUTROPHILS: 56 % (ref 40–73)
PLATELET: 413 10*3/uL (ref 135–420)
RBC: 4 M/uL — ABNORMAL LOW (ref 4.20–5.30)
RDW: 23.6 % — ABNORMAL HIGH (ref 11.6–14.5)
WBC: 5.4 10*3/uL (ref 4.6–13.2)

## 2015-07-03 LAB — URINALYSIS W/ RFLX MICROSCOPIC
Bilirubin: NEGATIVE
Blood: NEGATIVE
Glucose: NEGATIVE mg/dL
Ketone: NEGATIVE mg/dL
Leukocyte Esterase: NEGATIVE
Nitrites: NEGATIVE
Protein: NEGATIVE mg/dL
Specific gravity: 1.023 (ref 1.005–1.030)
Urobilinogen: 1 EU/dL (ref 0.2–1.0)
pH (UA): 6 (ref 5.0–8.0)

## 2015-07-03 LAB — METABOLIC PANEL, COMPREHENSIVE
A-G Ratio: 0.9 (ref 0.8–1.7)
ALT (SGPT): 13 U/L (ref 13–56)
AST (SGOT): 9 U/L — ABNORMAL LOW (ref 15–37)
Albumin: 3.4 g/dL (ref 3.4–5.0)
Alk. phosphatase: 88 U/L (ref 45–117)
Anion gap: 5 mmol/L (ref 3.0–18)
BUN/Creatinine ratio: 8 — ABNORMAL LOW (ref 12–20)
BUN: 8 MG/DL (ref 7.0–18)
Bilirubin, total: 0.1 MG/DL — ABNORMAL LOW (ref 0.2–1.0)
CO2: 28 mmol/L (ref 21–32)
Calcium: 8.5 MG/DL (ref 8.5–10.1)
Chloride: 105 mmol/L (ref 100–108)
Creatinine: 0.96 MG/DL (ref 0.6–1.3)
GFR est AA: >60 mL/min/{1.73_m2} (ref >60)
GFR est non-AA: >60 mL/min/{1.73_m2} (ref >60)
Globulin: 3.9 g/dL (ref 2.0–4.0)
Glucose: 123 mg/dL — ABNORMAL HIGH (ref 74–99)
Potassium: 3.8 mmol/L (ref 3.5–5.5)
Protein, total: 7.3 g/dL (ref 6.4–8.2)
Sodium: 138 mmol/L (ref 136–145)

## 2015-07-03 LAB — HCG URINE, QL: HCG urine, QL: NEGATIVE

## 2015-07-03 LAB — LIPASE: Lipase: 247 U/L (ref 73–393)

## 2015-07-03 MED ORDER — IOPAMIDOL 61 % IV SOLN
300 mg iodine /mL (61 %) | Freq: Once | INTRAVENOUS | Status: AC
Start: 2015-07-03 — End: 2015-07-02
  Administered 2015-07-03: 01:00:00 via INTRAVENOUS

## 2015-07-03 MED ORDER — POLYETHYLENE GLYCOL 3350 100 % ORAL POWDER
17 gram/dose | Freq: Every day | ORAL | Status: AC
Start: 2015-07-03 — End: ?

## 2015-07-03 MED ORDER — ONDANSETRON 4 MG TAB, RAPID DISSOLVE
4 mg | ORAL_TABLET | Freq: Three times a day (TID) | ORAL | Status: DC | PRN
Start: 2015-07-03 — End: 2015-07-07

## 2015-07-03 MED FILL — ISOVUE-300  61 % INTRAVENOUS SOLUTION: 300 mg iodine /mL (61 %) | INTRAVENOUS | Qty: 100

## 2015-07-03 MED FILL — HYDROMORPHONE (PF) 1 MG/ML IJ SOLN: 1 mg/mL | INTRAMUSCULAR | Qty: 1

## 2015-07-03 MED FILL — ONDANSETRON (PF) 4 MG/2 ML INJECTION: 4 mg/2 mL | INTRAMUSCULAR | Qty: 2

## 2015-07-03 NOTE — ED Notes (Signed)
Both written and verbal discharge instructions given to pt with verbalized understanding of home care and follow up. Prescriptions given. Pt has ride home with her significant other.

## 2015-07-06 ENCOUNTER — Inpatient Hospital Stay: Admit: 2015-07-06 | Payer: Self-pay | Attending: Diagnostic Radiology | Primary: Family Medicine

## 2015-07-06 DIAGNOSIS — D259 Leiomyoma of uterus, unspecified: Secondary | ICD-10-CM

## 2015-07-06 LAB — CBC W/O DIFF
HCT: 34.1 % — ABNORMAL LOW (ref 35.0–45.0)
HGB: 10.6 g/dL — ABNORMAL LOW (ref 12.0–16.0)
MCH: 25.8 PG (ref 24.0–34.0)
MCHC: 31.1 g/dL (ref 31.0–37.0)
MCV: 83 FL (ref 74.0–97.0)
MPV: 10.3 FL (ref 9.2–11.8)
PLATELET: 426 10*3/uL — ABNORMAL HIGH (ref 135–420)
RBC: 4.11 M/uL — ABNORMAL LOW (ref 4.20–5.30)
RDW: 22.3 % — ABNORMAL HIGH (ref 11.6–14.5)
WBC: 5.1 10*3/uL (ref 4.6–13.2)

## 2015-07-06 LAB — HCG URINE, QL. - POC: Pregnancy test,urine (POC): NEGATIVE

## 2015-07-06 LAB — METABOLIC PANEL, BASIC
Anion gap: 6 mmol/L (ref 3.0–18)
BUN/Creatinine ratio: 10 — ABNORMAL LOW (ref 12–20)
BUN: 9 MG/DL (ref 7.0–18)
CO2: 28 mmol/L (ref 21–32)
Calcium: 9.1 MG/DL (ref 8.5–10.1)
Chloride: 107 mmol/L (ref 100–108)
Creatinine: 0.88 MG/DL (ref 0.6–1.3)
GFR est AA: >60 mL/min/{1.73_m2} (ref >60)
GFR est non-AA: >60 mL/min/{1.73_m2} (ref >60)
Glucose: 86 mg/dL (ref 74–99)
Potassium: 4.2 mmol/L (ref 3.5–5.5)
Sodium: 141 mmol/L (ref 136–145)

## 2015-07-06 LAB — PTT: aPTT: 38.6 s — ABNORMAL HIGH (ref 23.0–36.4)

## 2015-07-06 LAB — PROTHROMBIN TIME + INR
INR: 1 (ref 0.8–1.2)
Prothrombin time: 13 s (ref 11.5–15.2)

## 2015-07-06 MED ORDER — SODIUM CHLORIDE 0.9 % IV
INTRAVENOUS | Status: DC
Start: 2015-07-06 — End: 2015-07-07

## 2015-07-06 MED ORDER — ZOLPIDEM 5 MG TAB
5 mg | Freq: Every evening | ORAL | Status: DC | PRN
Start: 2015-07-06 — End: 2015-07-07
  Administered 2015-07-07: 03:00:00 via ORAL

## 2015-07-06 MED ORDER — SPIRONOLACTONE 25 MG TAB
25 mg | Freq: Every day | ORAL | Status: DC
Start: 2015-07-06 — End: 2015-07-07
  Administered 2015-07-07: 15:00:00 via ORAL

## 2015-07-06 MED ORDER — PANTOPRAZOLE 40 MG TAB, DELAYED RELEASE
40 mg | Freq: Every day | ORAL | Status: DC
Start: 2015-07-06 — End: 2015-07-07
  Administered 2015-07-07: 15:00:00 via ORAL

## 2015-07-06 MED ORDER — LIDOCAINE (PF) 10 MG/ML (1 %) IJ SOLN
10 mg/mL (1 %) | Freq: Once | INTRAMUSCULAR | Status: AC
Start: 2015-07-06 — End: 2015-07-06
  Administered 2015-07-06: 18:00:00 via SUBCUTANEOUS

## 2015-07-06 MED ORDER — METOPROLOL SUCCINATE SR 50 MG 24 HR TAB
50 mg | ORAL | Status: DC
Start: 2015-07-06 — End: 2015-07-07
  Administered 2015-07-06: 23:00:00 via ORAL

## 2015-07-06 MED ORDER — HEPARIN (PORCINE) IN NS (PF) 1,000 UNIT/500 ML IV
1000 unit/500 mL | INTRAVENOUS | Status: AC
Start: 2015-07-06 — End: 2015-07-07

## 2015-07-06 MED ORDER — NALOXONE 0.4 MG/ML INJECTION
0.4 mg/mL | INTRAMUSCULAR | Status: DC | PRN
Start: 2015-07-06 — End: 2015-07-07

## 2015-07-06 MED ORDER — ONDANSETRON (PF) 4 MG/2 ML INJECTION
4 mg/2 mL | INTRAMUSCULAR | Status: AC
Start: 2015-07-06 — End: 2015-07-06
  Administered 2015-07-06: 17:00:00 via INTRAVENOUS

## 2015-07-06 MED ORDER — SODIUM CHLORIDE 0.9 % IJ SYRG
INTRAMUSCULAR | Status: DC | PRN
Start: 2015-07-06 — End: 2015-07-07

## 2015-07-06 MED ORDER — FLUMAZENIL 0.1 MG/ML IV SOLN
0.1 mg/mL | INTRAVENOUS | Status: DC | PRN
Start: 2015-07-06 — End: 2015-07-07

## 2015-07-06 MED ORDER — MIDAZOLAM 1 MG/ML IJ SOLN
1 mg/mL | INTRAMUSCULAR | Status: DC | PRN
Start: 2015-07-06 — End: 2015-07-07
  Administered 2015-07-06 (×6): via INTRAVENOUS

## 2015-07-06 MED ORDER — OXYCODONE-ACETAMINOPHEN 5 MG-325 MG TAB
5-325 mg | ORAL | Status: AC
Start: 2015-07-06 — End: 2015-07-06
  Administered 2015-07-06: 17:00:00 via ORAL

## 2015-07-06 MED ORDER — SODIUM CHLORIDE 0.9 % IJ SYRG
Freq: Three times a day (TID) | INTRAMUSCULAR | Status: DC
Start: 2015-07-06 — End: 2015-07-07
  Administered 2015-07-06 – 2015-07-07 (×4): via INTRAVENOUS

## 2015-07-06 MED ORDER — NALOXONE 0.4 MG/ML INJECTION
0.4 mg/mL | INTRAMUSCULAR | Status: DC | PRN
Start: 2015-07-06 — End: 2015-07-06

## 2015-07-06 MED ORDER — ONDANSETRON (PF) 4 MG/2 ML INJECTION
4 mg/2 mL | INTRAMUSCULAR | Status: AC
Start: 2015-07-06 — End: 2015-07-06
  Administered 2015-07-06: 19:00:00 via INTRAVENOUS

## 2015-07-06 MED ORDER — DOCUSATE SODIUM 100 MG CAP
100 mg | Freq: Two times a day (BID) | ORAL | Status: DC
Start: 2015-07-06 — End: 2015-07-07
  Administered 2015-07-07 (×2): via ORAL

## 2015-07-06 MED ORDER — CEFAZOLIN 2 GM/50 ML IN DEXTROSE (ISO-OSMOTIC) IVPB
2 gram/50 mL | INTRAVENOUS | Status: AC
Start: 2015-07-06 — End: 2015-07-06
  Administered 2015-07-06: 17:00:00 via INTRAVENOUS

## 2015-07-06 MED ORDER — KETOROLAC TROMETHAMINE 30 MG/ML INJECTION
30 mg/mL (1 mL) | Freq: Once | INTRAMUSCULAR | Status: AC
Start: 2015-07-06 — End: 2015-07-06
  Administered 2015-07-06: 18:00:00 via INTRAVENOUS

## 2015-07-06 MED ORDER — POLYETHYLENE GLYCOL 3350 17 GRAM (100 %) ORAL POWDER PACKET
17 gram | Freq: Every day | ORAL | Status: DC
Start: 2015-07-06 — End: 2015-07-07
  Administered 2015-07-07: 15:00:00 via ORAL

## 2015-07-06 MED ORDER — SODIUM CHLORIDE 0.9 % IV
INTRAVENOUS | Status: DC
Start: 2015-07-06 — End: 2015-07-07
  Administered 2015-07-06: 16:00:00 via INTRAVENOUS

## 2015-07-06 MED ORDER — IODIXANOL 320 MG/ML IV SOLN
320 mg iodine/mL | Freq: Once | INTRAVENOUS | Status: AC
Start: 2015-07-06 — End: 2015-07-06
  Administered 2015-07-06: 18:00:00 via INTRA_ARTERIAL

## 2015-07-06 MED ORDER — SODIUM CHLORIDE 0.9 % IJ SYRG
Freq: Three times a day (TID) | INTRAMUSCULAR | Status: DC
Start: 2015-07-06 — End: 2015-07-07
  Administered 2015-07-06 – 2015-07-07 (×3): via INTRAVENOUS

## 2015-07-06 MED ORDER — HYDRALAZINE 10 MG TAB
10 mg | Freq: Four times a day (QID) | ORAL | Status: DC | PRN
Start: 2015-07-06 — End: 2015-07-07
  Administered 2015-07-07 (×3): via ORAL

## 2015-07-06 MED ORDER — HYDROMORPHONE (PF) 30 MG/30 ML (1 MG/ML) IN 0.9% NACL PCA SYRINGE
30 mg/ ml | INTRAVENOUS | Status: DC
Start: 2015-07-06 — End: 2015-07-07
  Administered 2015-07-06 – 2015-07-07 (×4): via INTRAVENOUS

## 2015-07-06 MED ORDER — SODIUM CHLORIDE 0.9 % IJ SYRG
Freq: Three times a day (TID) | INTRAMUSCULAR | Status: DC
Start: 2015-07-06 — End: 2015-07-07
  Administered 2015-07-06 – 2015-07-07 (×3): via INTRAVENOUS

## 2015-07-06 MED ORDER — SODIUM CHLORIDE 0.9 % IV
INTRAVENOUS | Status: DC
Start: 2015-07-06 — End: 2015-07-07
  Administered 2015-07-06: 21:00:00 via INTRAVENOUS

## 2015-07-06 MED ORDER — DIPHENHYDRAMINE 25 MG CAP
25 mg | ORAL | Status: DC | PRN
Start: 2015-07-06 — End: 2015-07-07

## 2015-07-06 MED ORDER — BISACODYL 10 MG RECTAL SUPPOSITORY
10 mg | Freq: Every day | RECTAL | Status: DC | PRN
Start: 2015-07-06 — End: 2015-07-06

## 2015-07-06 MED ORDER — FENTANYL CITRATE (PF) 50 MCG/ML IJ SOLN
50 mcg/mL | INTRAMUSCULAR | Status: DC | PRN
Start: 2015-07-06 — End: 2015-07-07
  Administered 2015-07-06 (×6): via INTRAVENOUS

## 2015-07-06 MED ORDER — SODIUM CHLORIDE 0.9 % IV
INTRAVENOUS | Status: DC
Start: 2015-07-06 — End: 2015-07-07
  Administered 2015-07-07: 17:00:00 via INTRAVENOUS

## 2015-07-06 MED FILL — BD POSIFLUSH NORMAL SALINE 0.9 % INJECTION SYRINGE: INTRAMUSCULAR | Qty: 10

## 2015-07-06 MED FILL — HYDROMORPHONE 30 MG/30 ML (1 MG/ML) IN 0.9% NACL INFUSION: 30 mg/ mL | INTRAVENOUS | Qty: 30

## 2015-07-06 MED FILL — SODIUM CHLORIDE 0.9 % IV: INTRAVENOUS | Qty: 1000

## 2015-07-06 MED FILL — OXYCODONE-ACETAMINOPHEN 5 MG-325 MG TAB: 5-325 mg | ORAL | Qty: 1

## 2015-07-06 MED FILL — MIDAZOLAM 1 MG/ML IJ SOLN: 1 mg/mL | INTRAMUSCULAR | Qty: 2

## 2015-07-06 MED FILL — LIDOCAINE (PF) 10 MG/ML (1 %) IJ SOLN: 10 mg/mL (1 %) | INTRAMUSCULAR | Qty: 30

## 2015-07-06 MED FILL — KETOROLAC TROMETHAMINE 30 MG/ML INJECTION: 30 mg/mL (1 mL) | INTRAMUSCULAR | Qty: 1

## 2015-07-06 MED FILL — VISIPAQUE 320 MG IODINE/ML INTRAVENOUS SOLUTION: 320 mg iodine/mL | INTRAVENOUS | Qty: 200

## 2015-07-06 MED FILL — ONDANSETRON (PF) 4 MG/2 ML INJECTION: 4 mg/2 mL | INTRAMUSCULAR | Qty: 2

## 2015-07-06 MED FILL — CEFAZOLIN 2 GM/50 ML IN DEXTROSE (ISO-OSMOTIC) IVPB: 2 gram/50 mL | INTRAVENOUS | Qty: 50

## 2015-07-06 MED FILL — FENTANYL CITRATE (PF) 50 MCG/ML IJ SOLN: 50 mcg/mL | INTRAMUSCULAR | Qty: 2

## 2015-07-06 MED FILL — METOPROLOL SUCCINATE SR 50 MG 24 HR TAB: 50 mg | ORAL | Qty: 2

## 2015-07-06 MED FILL — HEPARIN (PORCINE) IN NS (PF) 1,000 UNIT/500 ML IV: 1000 unit/500 mL | INTRAVENOUS | Qty: 1500

## 2015-07-06 NOTE — Procedures (Signed)
DATE: 07/06/15    PROCEDURE(S):  1. Uterine artery embolization    INDICATION: 44 year old female. History of symptomatic fibroid uterus. Uterine fibroid embolization requested.    TECHNIQUE: Expected benefits, potential risks, and alternatives to the procedure were discussed with the patient and all questions were answered. Discussed risks include - but are not limited to - bleeding, infection, vascular injury, thromboembolic events, radiation exposure, medication reaction, contrast induced nephropathy, idiosyncratic contrast reaction, postembolization syndrome, fibroid passage, iatrogenic menopause, the possibility of hysterectomy, and missed malignancy. Informed consent was obtained. The patient was placed on the procedure table, the bilateral groins were prepared in usual sterile fashion, and the ensuing procedure was performed with full barrier precaution, including caps, masks, gowns, gloves, and drapes. Procedure verification was completed. Moderate sedation was administered by qualified nursing personnel, who also monitored the patient throughout the procedure.    Upon physician review, a patent vessel was not able to be identified. Consequently, ultrasound evaluation of potential access sites was performed. After successfully identifying a patent vessel, intravascular access was achieved under real-time ultrasound guidance. The needle was visualized entering the vessel. An image was permanently recorded and archived in PACS. A 5 French angiographic sheath was placed. In similar fashion, access was then established on the contralateral side. From the right side, a 5 French glide Cobra catheter was advanced over the aortoiliac bifurcation into the left internal iliac artery. Using coaxial technique, a microcatheter was advanced into the left uterine artery. In similar fashion, a microcatheter was placed in the contralateral uterine artery.     Contrast was injected for preprocedure uterine angiography.  Simultaneous transcatheter embolization was then performed under continuous fluoroscopic observation. In total, 4 vials of 500-700 micron Embosphere particles and 1 vial of 355-500 micron Contour polyvinyl alcohol particles were used. Following embolization, contrast was again injected bilaterally for postprocedure uterine angiography. Preservative-free lidocaine was administered intra-arterially.    The catheters were removed, both sheaths were removed, and hemostasis was achieved using Mynx arterial closure devices. Sterile dressings were applied.     ACCESS: Bilateral common femoral arteries    FINDINGS:  1. Enlarged tortuous bilateral uterine arteries supplying fibroid uterus  2. Adequate stasis of contrast following embolization  3. Patent bilateral external iliac arteries at termination of case  4. Excreted intrarenal contrast demonstrating no evidence of hydronephrosis    OPERATOR: Kallie Locks, MD    ASSISTANT(S): None    MEDICATION(S): Lidocaine, fentanyl, midazolam, cefazolin, keterolac, dilaudid    SEDATION TIME: 65 minutes    CONTRAST: 70 mL    FLUOROSCOPY: 15.0 minutes    SPECIMEN: None    ESTIMATED BLOOD LOSS: Minimal    BLOOD ADMINISTERED: None    DRAIN(S): None    IMPLANT(S): None    COMPLICATION(S): None    IMPRESSION:  Technically successful uterine fibroid embolization.

## 2015-07-06 NOTE — Progress Notes (Signed)
1600: Patient arrived to unit. No signs of acute distress. Dressings to bilateral groin clean, dry, and intact. Pedal pulses are palpable and equal bilaterally. No signs of bleeding. BP is elevated. Paged hospitalist to notify.     1900: Patient ambulated in hallway.  Foley catheter discontinued.

## 2015-07-06 NOTE — Other (Signed)
Bedside and Verbal shift change report given to Parke Simmers, Therapist, sports (oncoming nurse) by Lerry Paterson, RN (offgoing nurse). Report included the following information SBAR, Kardex, MAR and Recent Results.    SITUATION:   ? Code Status: Full Code  ? Reason for Admission: Fibroid, uterine [D25.9]    ? Hospital day:   ? Problem List:       Hospital Problems  Date Reviewed: 05/05/2015    None          BACKGROUND:    Past Medical History:   Past Medical History   Diagnosis Date   ??? Hypertension    ??? Diabetes ([place])    ??? Genital herpes    ??? Ovarian cyst    ??? Obesity    ??? Chronic pain      left arm, chest and back         Patient taking anticoagulants no     ASSESSMENT:   ? Changes in Assessment Throughout Shift:foley d/c. Due to void by 0300    ? Patient has Central Line: no Reasons if yes:   ? Patient has Foley Cath: no Reasons if yes:      ? Last Vitals:     Filed Vitals:    07/06/15 1600   BP: 177/99   Pulse: 80   Temp: 97.8 ??F (36.6 ??C)   Resp: 17   Height:    Weight:    SpO2: 98%   LMP: 06/27/2015       ? IV and DRAINS (will only show if present)   Peripheral IV 07/06/15 Right Antecubital-Site Assessment: Clean, dry, & intact  [REMOVED] Sheath 07/06/15-Site Assessment: Clean, dry, & intact  [REMOVED] Sheath 07/06/15-Site Assessment: Clean, dry, & intact    ? WOUND (if present)   Wound Type:  surgical   Dressing present     Wound Concerns/Notes:  none    ? PAIN    Pain Assessment    Pain Intensity 1: 0 (07/06/15 1600)              Patient Stated Pain Goal: 0  o Interventions for Pain:  Dilaudid PCA  o Intervention effective: yes  o Time of last intervention: PCA   o Reassessment Completed: yes     ? Last 3 Weights:  Last 3 Recorded Weights in this Encounter    07/06/15 1126   Weight: 74.39 kg (164 lb)     Weight change:     ? INTAKE/OUPUT    Current Shift: 07/20 1901 - 07/21 0700  In: 25 [P.O.:25]  Out: 300 [Urine:300]    Last three shifts: 07/19 0701 - 07/20 1900  In: 0   Out: 350 [Urine:350]    ? LAB RESULTS     Recent Labs       07/06/15   1140   WBC  5.1   HGB  10.6*   HCT  34.1*   PLT  426*        Recent Labs      07/06/15   1140   NA  141   K  4.2   GLU  86   BUN  9   CREA  0.88   CA  9.1   INR  1.0       RECOMMENDATIONS AND DISCHARGE PLANNING     1. Pending tests/procedures/ Plan of Care or Other Needs: pain management; PT and OT     2. Discharge plan for patient and Needs/Barriers: none  3. Estimated Discharge Date: 07/07/2015 Posted on Whiteboard in Patient???s Room: yes      4. The patient's care plan was reviewed with the oncoming nurse.       "HEALS" SAFETY CHECK      Fall Risk    Total Score: 1    Safety Measures: Safety Measures: Bed/Chair-Wheels locked, Bed in low position, Call light within reach, Gripper socks, Side rails X 3    A safety check occurred in the patient's room between off going nurse and oncoming nurse listed above.    The safety check included the below items  Area Items   H  High Alert Medications ? Verify all high alert medication drips (heparin, PCA, etc.)   E  Equipment ? Suction is set up for ALL patients (with yanker)  ? Red plugs utilized for all equipment (IV pumps, etc.)  ? WOW???s wiped down at end of shift.  ? Room stocked with oxygen, suction, and other unit-specific supplies   A  Alarms ? Bed alarm is set for fall risk patients  ? Ensure chair alarm is in place and activated if patient is up in a chair   L  Lines ? Check IV for any infiltration  ? Foley bag is empty if patient has a Foley   ? Tubing and IV bags are labeled   S  Safety   ? Room is clean, patient is clean, and equipment is clean.  ? Hallways are clear from equipment besides carts.   ? Fall bracelet on for fall risk patients  ? Ensure room is clear and free of clutter  ? Suction is set up for ALL patients (with yanker)  ? Hallways are clear from equipment besides carts.   ? Isolation precautions followed, supplies available outside room, sign posted     Lerry Paterson, RN

## 2015-07-06 NOTE — H&P (Signed)
[facility]                               [address]                          [place], [place] [address]                               HISTORY AND PHYSICAL    PATIENT:      April Perry, April Perry  MRN:             [id number]      DATE:   07/06/2015  BILLING:         [id number]   DOB:    [birth year]  ROOM:         [id number]  REFERRING:    Cathren Laine, MD  DICTATING:    Michel Bickers, MD        CHIEF COMPLAINT: The patient is status post uterine artery embolization.    HISTORY OF PRESENT ILLNESS: This is a 44 year old female who underwent a  uterine artery embolization by interventional radiologist earlier today.  Subsequent to the procedure, I received a call requesting that the patient  be admitted on observation status to the hospitalist service. When I saw  the patient, the patient was lying in bed in her room. The patient states  that the pain medication is controlling the pain, but she feels some  bladder pressure. The patient denies any nausea or vomiting. No history of  fever or chills. No history of any headaches, numbness, or focal weakness.  No history of any chest pain or shortness of breath. No history of any  cough. No history of any diarrhea or rectal bleeding. No history of any leg  swelling. No history of any rash. The patient does give some history of  right flank pain over the last few weeks and was recently seen in the ER on  07/02/2015 and had a CT scan done at that time.    PAST MEDICAL HISTORY: Hypertension, diet-controlled type 2 diabetes, anemia  for which the patient has received some iron transfusions recently, and a  recent right renal cyst noted on CT scan done in the ER recently.    ALLERGIES  1. DEMEROL WHICH CAUSES A RASH.  2. LISINOPRIL WHICH CAUSES A COUGH.    SOCIAL HISTORY: The patient denies any tobacco use or alcohol use. The  patient works as a Administrator.    FAMILY HISTORY: The patient says her mother had diabetes and   hypothyroidism.    REVIEW OF SYSTEMS  Apart from complaints mentioned above, a complete review of systems was  done and is negative.    PRIOR TO ADMISSION MEDICATIONS: Obtained verbally from the patient include:      1. Aldactone 50 mg p.o. daily.  2. Metoprolol succinate extended release 100 mg p.o. daily.  3. Protonix 40 mg p.o. daily.  4. MiraLax.    Please note that I saw the patient in the room and I also saw the patient  subsequently ambulating in the hallway.    PHYSICAL EXAMINATION  VITAL SIGNS: Today show temperature of 98.3, pulse rate of 72, blood  pressure 161/92, respiratory rate of 18, oxygen saturations of 100%.  GENERAL: This is a 44 year old female lying in bed in no  apparent distress.  The patient's fiancee is at bedside.  HEAD: Normocephalic, atraumatic.  EYES: Pupils are reactive to light.  EARS, NOSE AND THROAT: No ear or nasal discharge is seen. Mucous membranes  are moist.  NECK: Supple. No JVD. No carotid bruit. No thyromegaly. No  lymphadenopathy.  LUNGS: Clear to auscultation bilaterally.  HEART: S1, S2 were heard. Regular rate and rhythm.  ABDOMEN: Soft. Bowel sounds are positive. Nondistended, nontender.  EXTREMITIES: No lower extremity edema noted. Pulses are 1+ bilaterally  equal.  NEUROLOGIC: The patient is awake and alert and oriented x3. No focal  neurological deficit was identified.    LABORATORY DATA: Today show WBC count of 5.1, hemoglobin 10.6, hematocrit  34.1, platelet count of 426. INR 1. Sodium 141, potassium 4.2, chloride of  107, CO2 28, glucose 86, BUN 9, creatinine 0.88.    IMPRESSION  1. Status post uterine artery embolization for uterine fibroid.  2. Hypertension.  3. Type 2 diabetes, which is diet-controlled.  4. Anemia for which the patient has been receiving iron infusions.  5. Right renal cyst which appears to be causing some symptoms of pain.    PLAN: The patient will be kept on observation status overnight. We will   continue IV fluids, pain medications as per the interventional radiologist.  The patient will be continued on a bowel regimen. We will continue other  preadmission medications including the metoprolol and Aldactone. The  patient will be continued on the MiraLax. Blood pressure will be monitored.  We will recheck labs in the morning. I discussed with the patient regarding  the right renal cyst and the patient is aware of that and I recommended  that she follow up with the urologist as an outpatient. The patient  verbalized understanding and agreed. I discussed the plan of care with the  patient and she verbalized understanding and agreed. I also discussed with  the patient regarding her level of care, and she wishes to be a FULL CODE.                     Michel Bickers, MD    VT:wmx  D: 07/06/2015  08:35 P  T: 07/06/2015  09:13 P  Job: [id number]  CScriptDoc #: [id number]  cc:   Kathlen Mody, MD        Cathren Laine, MD        Michel Bickers, MD

## 2015-07-06 NOTE — Progress Notes (Signed)
OUTPATIENT HISTORY AND PHYSICAL      Today 07/06/2015     Indication/Symptoms:   April Perry is a 44 y.o. female with h/o uterine fibroids presents for an image guided uterine fibroid embolization.    Current Meds:    Prior to Admission medications    Medication Sig Start Date End Date Taking? Authorizing Provider   polyethylene glycol (MIRALAX) 17 gram/dose powder Take 17 g by mouth daily. 1 tablespoon with 8 oz of water daily 07/02/15  Yes Marya Landry, MD   spironolactone (ALDACTONE) 50 mg tablet Take 50 mg by mouth daily.   Yes Phys Other, MD   pantoprazole (PROTONIX) 40 mg tablet Take 1 Tab by mouth daily for 20 days. 06/28/15 07/18/15 Yes Chelsea Aus, MD   METOPROLOL SUCCINATE PO Take  by mouth.   Yes Historical Provider   ondansetron (ZOFRAN ODT) 4 mg disintegrating tablet Take 1 Tab by mouth every eight (8) hours as needed for Nausea. 07/02/15   Marya Landry, MD       Allergies:      Allergies   Allergen Reactions   ??? Demerol [Meperidine] Rash   ??? Lisinopril Unable to Obtain       Comorbid Conditions:    Past Medical History   Diagnosis Date   ??? Hypertension    ??? Diabetes ([place])    ??? Genital herpes    ??? Ovarian cyst    ??? Obesity    ??? Chronic pain      left arm, chest and back          Past Surgical History   Procedure Laterality Date   ??? Hx other surgical       skin graft right arm   ??? Pr abdomen surgery proc unlisted     ??? Hx breast biopsy     ??? Hx tubal ligation       bilateral       Data:    BP 160/88 mmHg   Pulse 68   Temp(Src) 98.3 ??F (36.8 ??C)   Resp 16   Ht 5\' 7"  (1.702 m)   Wt 164 lb   BMI 25.68 kg/m2   SpO2 99%   LMP 06/27/2015   Breastfeeding? No:  Recent Labs      07/06/15   1140   PLT  426*     Recent Labs      07/06/15   1140   INR  1.0   APTT  38.6*       The H & P and/or progress notes and any available imaging were reviewed.  The risks, indications and possible alternatives to the procedure, including doing nothing, were discussed and informed consent was obtained.     Physical Exam:      Mental status:   Alert and oriented.   Examination specific to the procedure proposed to be performed and any co morbid conditions:      Mallampati classification 2 ,  ASA II   Heart:   RRR.   Lungs:   CTAB.  No wheezes, rales or rhonchi.    The patient is an appropriate candidate to undergo the planned procedure and sedation.    Fransisca Kaufmann, PA

## 2015-07-06 NOTE — H&P (Signed)
INTERVENTIONAL RADIOLOGY OUTPATIENT HISTORY & PHYSICAL     July 06, 2015       4:14 PM     Indication/symptoms: April Perry is a 44 y.o. female who presents for uterine fibroid embolization. The medical record and relevant imaging were reviewed. Expected benefits, potential risks, and alternatives to the procedure were discussed with the patient (and/or surrogate decision maker, as applicable) and all questions were answered. Informed consent was obtained.    Current medications:    Current Facility-Administered Medications   Medication Dose Route Frequency Provider Last Rate Last Dose   ??? 0.9% sodium chloride infusion  20 mL/hr IntraVENous CONTINUOUS Noralyn Pick, MD 20 mL/hr at 07/06/15 1201 20 mL/hr at 07/06/15 1201   ??? sodium chloride (NS) flush 5-10 mL  5-10 mL IntraVENous Q8H Fransisca Kaufmann, PA       ??? sodium chloride (NS) flush 5-10 mL  5-10 mL IntraVENous PRN Fransisca Kaufmann, PA       ??? HYDROmorphone (PF) (DILAUDID) 30 mg / 30 mL PCA   IntraVENous TITRATE Fransisca Kaufmann, PA       ??? 0.9% sodium chloride infusion  75 mL/hr IntraVENous CONTINUOUS Fransisca Kaufmann, PA       ??? sodium chloride (NS) flush 5-10 mL  5-10 mL IntraVENous Q8H Fransisca Kaufmann, PA       ??? sodium chloride (NS) flush 5-10 mL  5-10 mL IntraVENous PRN Fransisca Kaufmann, PA       ??? fentaNYL citrate (PF) injection 50 mcg  50 mcg IntraVENous Multiple Fransisca Kaufmann, PA   25 mcg at 07/06/15 1415   ??? midazolam (VERSED) injection 1 mg  1 mg IntraVENous Multiple Fransisca Kaufmann, PA   0.5 mg at 07/06/15 1410   ??? flumazenil (ROMAZICON) 0.1 mg/mL injection 0.2 mg  0.2 mg IntraVENous Multiple Renaee Munda Fontanares, PA       ??? heparinized saline 2 units/mL 1,000 unit/500 mL infusion            ??? 0.9% sodium chloride infusion  120 mL/hr IntraVENous See Admin Instructions Noralyn Pick, MD        ??? sodium chloride (NS) flush 5-10 mL  5-10 mL IntraVENous Q8H Noralyn Pick, MD       ??? sodium chloride (NS) flush 5-10 mL  5-10 mL IntraVENous PRN Noralyn Pick, MD       ??? naloxone [facility]) injection 0.4 mg  0.4 mg IntraVENous PRN Noralyn Pick, MD       ??? zolpidem (AMBIEN) tablet 5 mg  5 mg Oral QHS PRN Noralyn Pick, MD       ??? diphenhydrAMINE (BENADRYL) capsule 25 mg  25 mg Oral Q4H PRN Noralyn Pick, MD           Allergies:  Allergies   Allergen Reactions   ??? Demerol [Meperidine] Rash   ??? Lisinopril Unable to Obtain       Past medical history:  Past Medical History   Diagnosis Date   ??? Hypertension    ??? Diabetes ([place])    ??? Genital herpes    ??? Ovarian cyst    ??? Obesity    ??? Chronic pain      left arm, chest and back       Past surgical history:  Past Surgical History   Procedure Laterality Date   ??? Hx other surgical       skin graft right  arm   ??? Pr abdomen surgery proc unlisted     ??? Hx breast biopsy     ??? Hx tubal ligation       bilateral       Data:    Vital signs:   BP 177/99 mmHg   Pulse 80   Temp(Src) 97.8 ??F (36.6 ??C)   Resp 17   Ht 5\' 7"  (1.702 m)   Wt 164 lb   BMI 25.68 kg/m2   SpO2 98%   LMP 06/27/2015   Breastfeeding? No:     Laboratory data:   Metabolic panel:  Lab Results   Component Value Date/Time    SODIUM 141 07/06/2015 11:40 AM    POTASSIUM 4.2 07/06/2015 11:40 AM    CHLORIDE 107 07/06/2015 11:40 AM    CO2 28 07/06/2015 11:40 AM    BUN 9 07/06/2015 11:40 AM    CREATININE 0.88 07/06/2015 11:40 AM    GLUCOSE 86 07/06/2015 11:40 AM     Complete blood count:  Lab Results   Component Value Date/Time    WBC 5.1 07/06/2015 11:40 AM    HGB 10.6 07/06/2015 11:40 AM    HCT 34.1 07/06/2015 11:40 AM    PLATELET 426 07/06/2015 11:40 AM     Coagulation parameters:  Lab Results   Component Value Date/Time    PROTHROMBIN TIME 13.0 07/06/2015 11:40 AM    INR 1.0 07/06/2015 11:40 AM    APTT 38.6 07/06/2015 11:40 AM          Physical examination:   General: Alert and oriented; no acute distress    Heart:     Regular rate/rhythm   Lungs:   Clear to ausculation bilaterally    The patient is an appropriate candidate to undergo the procedure and, if necessary, to receive moderate sedation/analgesia.    Noralyn Pick, MD

## 2015-07-06 NOTE — Procedures (Signed)
DATE: 07/06/15    PROCEDURE(S):  1. Uterine artery embolization    INDICATION: 44 year old female. History of symptomatic fibroid uterus. Uterine fibroid embolization requested.    TECHNIQUE: Expected benefits, potential risks, and alternatives to the procedure were discussed with the patient and all questions were answered. Discussed risks include - but are not limited to - bleeding, infection, vascular injury, thromboembolic events, radiation exposure, medication reaction, contrast induced nephropathy, idiosyncratic contrast reaction, postembolization syndrome, fibroid passage, iatrogenic menopause, the possibility of hysterectomy, and missed malignancy. Informed consent was obtained. The patient was placed on the procedure table, the bilateral groins were prepared in usual sterile fashion, and the ensuing procedure was performed with full barrier precaution, including caps, masks, gowns, gloves, and drapes. Procedure verification was completed. Moderate sedation was administered by qualified nursing personnel, who also monitored the patient throughout the procedure.    Upon physician review, a patent vessel was not able to be identified. Consequently, ultrasound evaluation of potential access sites was performed. After successfully identifying a patent vessel, intravascular access was achieved under real-time ultrasound guidance. The needle was visualized entering the vessel. An image was permanently recorded and archived in PACS. A 5 French angiographic sheath was placed. In similar fashion, access was then established on the contralateral side. From the right side, a 5 French glide Cobra catheter was advanced over the aortoiliac bifurcation into the left internal iliac artery. Using coaxial technique, a microcatheter was advanced into the left uterine artery. In similar fashion, a microcatheter was placed in the contralateral uterine artery.      Contrast was injected for preprocedure uterine angiography. Simultaneous transcatheter embolization was then performed under continuous fluoroscopic observation. In total, 4 vials of 500-700 micron Embosphere particles and 1 vial of 355-500 micron Contour polyvinyl alcohol particles were used. Following embolization, contrast was again injected bilaterally for postprocedure uterine angiography. Preservative-free lidocaine was administered intra-arterially.    The catheters were removed, both sheaths were removed, and hemostasis was achieved using Mynx arterial closure devices. Sterile dressings were applied.     ACCESS: Bilateral common femoral arteries    FINDINGS:  1. Enlarged tortuous bilateral uterine arteries supplying fibroid uterus  2. Adequate stasis of contrast following embolization  3. Patent bilateral external iliac arteries at termination of case  4. Excreted intrarenal contrast demonstrating no evidence of hydronephrosis    OPERATOR: Kallie Locks, MD    ASSISTANT(S): None    MEDICATION(S): Lidocaine, fentanyl, midazolam, cefazolin, keterolac, dilaudid    SEDATION TIME: 65 minutes    CONTRAST: 70 mL    FLUOROSCOPY: 15.0 minutes    SPECIMEN: None    ESTIMATED BLOOD LOSS: Minimal    BLOOD ADMINISTERED: None    DRAIN(S): None    IMPLANT(S): None    COMPLICATION(S): None    IMPRESSION:  Technically successful uterine fibroid embolization.

## 2015-07-06 NOTE — Progress Notes (Signed)
Interventional Radiology      Pt is s/p uterine fibroid embolization and admitted for observation.    Discharge instructions per Dr Fransico Him:    Ibuprofen 600 mg QID #84  Vicoden 5-500 mg prn pain #30  Zofran 4 mg QID prn nausea #30  Colace 50 mg QID prn constipation     3 month outpatient follow up with Dr Fransico Him  6 month outpatient follow up with with Dr Fransico Him with MRI pelvis with contrast prior    Fransisca Kaufmann, PA

## 2015-07-06 NOTE — H&P (Signed)
[id number]

## 2015-07-06 NOTE — Other (Addendum)
Cath holding summary    Patient escorted to cath holding from waiting area ambulatory, alert and oriented x 4, voicing no complaints.  Changed into gown and placed on monitor.   NPO since MN.  Lab results, med rec and H&P reviewed on chart.  PIV x 1 inserted without difficulty. Family to bedside.  1125 - 16 fr. Foley catheter placed, pt tolerated procedure well. 40 cc's of Clear yellow urine output     1150 POC urine preg negative  1315 TRANSFER - OUT REPORT:    Verbal report given to [place] on [place]  being transferred to IR LAB for ordered procedure       Report consisted of patient???s Situation, Background, Assessment and   Recommendations(SBAR).     Information from the following report(s) SBAR, Procedure Summary and MAR was reviewed with the receiving nurse.    Lines:   Peripheral IV 07/06/15 Right Antecubital (Active)        Opportunity for questions and clarification was provided.      Patient transported with:   [employer]

## 2015-07-06 NOTE — Progress Notes (Signed)
Chaplain conducted an initial consultation and Spiritual Assessment for April Perry, who is a 44 y.o.,female. Patient???s Primary Language is: [place].   According to the patient???s EMR Religious Affiliation is: No religion.     The reason the Patient came to the hospital is:   Patient Active Problem List    Diagnosis Date Noted   ??? Renal cyst 05/05/2015   ??? 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 Hypertension    ??? Diabetes ([place])    ??? Chronic pain         The Chaplain provided the following Interventions:  Initiated a relationship of care and support.   Explored issues of faith, belief, spirituality and religious/ritual needs while hospitalized.  Listened empathically.  Provided chaplaincy education.  Provided information about Spiritual Care Services.  Offered prayer and assurance of continued prayers on patient's behalf.   Chart reviewed.    The following outcomes were achieved:  Patient shared limited information about both their medical narrative and spiritual journey/beliefs.  Patient processed feeling about current hospitalization.  Patient expressed gratitude for the Chaplain's visit.    Assessment:  Patient does not have any religious/cultural needs that will affect patient???s preferences in health care.  Patient did not indicate any spiritual or religious issues which require Spiritual Care Services interventions at this time.       Plan:  Chaplains will continue to follow and will provide pastoral care on an as needed/requested basis.  Chaplain recommends bedside caregivers page chaplain on duty if patient shows signs of acute spiritual or emotional distress.    [employer], MDiv.  Psychologist, educational  Office [phone]

## 2015-07-07 LAB — CBC WITH AUTOMATED DIFF
ABS. BASOPHILS: 0 10*3/uL (ref 0.0–0.1)
ABS. EOSINOPHILS: 0 10*3/uL (ref 0.0–0.4)
ABS. LYMPHOCYTES: 0.6 10*3/uL — ABNORMAL LOW (ref 0.9–3.6)
ABS. MONOCYTES: 0.5 10*3/uL (ref 0.05–1.2)
ABS. NEUTROPHILS: 11.3 10*3/uL — ABNORMAL HIGH (ref 1.8–8.0)
BASOPHILS: 0 % (ref 0–2)
EOSINOPHILS: 0 % (ref 0–5)
HCT: 34.2 % — ABNORMAL LOW (ref 35.0–45.0)
HGB: 10.6 g/dL — ABNORMAL LOW (ref 12.0–16.0)
LYMPHOCYTES: 5 % — ABNORMAL LOW (ref 21–52)
MCH: 26.1 PG (ref 24.0–34.0)
MCHC: 31 g/dL (ref 31.0–37.0)
MCV: 84.2 FL (ref 74.0–97.0)
MONOCYTES: 4 % (ref 3–10)
MPV: 10.3 FL (ref 9.2–11.8)
NEUTROPHILS: 91 % — ABNORMAL HIGH (ref 40–73)
PLATELET: 489 10*3/uL — ABNORMAL HIGH (ref 135–420)
RBC: 4.06 M/uL — ABNORMAL LOW (ref 4.20–5.30)
RDW: 22.1 % — ABNORMAL HIGH (ref 11.6–14.5)
WBC: 12.4 10*3/uL (ref 4.6–13.2)

## 2015-07-07 LAB — METABOLIC PANEL, COMPREHENSIVE
A-G Ratio: 0.9 (ref 0.8–1.7)
ALT (SGPT): 13 U/L (ref 13–56)
AST (SGOT): 6 U/L — ABNORMAL LOW (ref 15–37)
Albumin: 3.6 g/dL (ref 3.4–5.0)
Alk. phosphatase: 99 U/L (ref 45–117)
Anion gap: 7 mmol/L (ref 3.0–18)
BUN/Creatinine ratio: 9 — ABNORMAL LOW (ref 12–20)
BUN: 7 MG/DL (ref 7.0–18)
Bilirubin, total: 0.2 MG/DL (ref 0.2–1.0)
CO2: 27 mmol/L (ref 21–32)
Calcium: 8.5 MG/DL (ref 8.5–10.1)
Chloride: 104 mmol/L (ref 100–108)
Creatinine: 0.78 MG/DL (ref 0.6–1.3)
GFR est AA: >60 mL/min/{1.73_m2} (ref >60)
GFR est non-AA: >60 mL/min/{1.73_m2} (ref >60)
Globulin: 3.9 g/dL (ref 2.0–4.0)
Glucose: 118 mg/dL — ABNORMAL HIGH (ref 74–99)
Potassium: 3.9 mmol/L (ref 3.5–5.5)
Protein, total: 7.5 g/dL (ref 6.4–8.2)
Sodium: 138 mmol/L (ref 136–145)

## 2015-07-07 LAB — PROTHROMBIN TIME + INR
INR: 1.2 (ref 0.8–1.2)
Prothrombin time: 14.4 s (ref 11.5–15.2)

## 2015-07-07 LAB — MAGNESIUM: Magnesium: 2 mg/dL (ref 1.8–2.4)

## 2015-07-07 MED ORDER — IBUPROFEN 600 MG TAB
600 mg | ORAL_TABLET | Freq: Four times a day (QID) | ORAL | Status: AC | PRN
Start: 2015-07-07 — End: 2015-07-17

## 2015-07-07 MED ORDER — HYDROMORPHONE 2 MG TAB
2 mg | ORAL_TABLET | Freq: Once | ORAL | Status: DC
Start: 2015-07-07 — End: 2015-07-07

## 2015-07-07 MED ORDER — ONDANSETRON HCL 4 MG TAB
4 mg | ORAL_TABLET | Freq: Three times a day (TID) | ORAL | Status: AC | PRN
Start: 2015-07-07 — End: ?

## 2015-07-07 MED ORDER — OXYCODONE-ACETAMINOPHEN 5 MG-325 MG TAB
5-325 mg | ORAL | Status: DC | PRN
Start: 2015-07-07 — End: 2015-07-07
  Administered 2015-07-07: 17:00:00 via ORAL

## 2015-07-07 MED ORDER — ONDANSETRON HCL 4 MG TAB
4 mg | Freq: Four times a day (QID) | ORAL | Status: DC | PRN
Start: 2015-07-07 — End: 2015-07-07
  Administered 2015-07-07: 17:00:00 via ORAL

## 2015-07-07 MED ORDER — DOCUSATE SODIUM 100 MG CAP
100 mg | ORAL_CAPSULE | Freq: Two times a day (BID) | ORAL | Status: AC
Start: 2015-07-07 — End: 2015-10-05

## 2015-07-07 MED ORDER — HYDROMORPHONE 2 MG TAB
2 mg | ORAL_TABLET | Freq: Four times a day (QID) | ORAL | Status: AC | PRN
Start: 2015-07-07 — End: ?

## 2015-07-07 MED ORDER — OXYCODONE-ACETAMINOPHEN 5 MG-325 MG TAB
5-325 mg | ORAL_TABLET | ORAL | Status: DC | PRN
Start: 2015-07-07 — End: 2015-07-07

## 2015-07-07 MED ORDER — KETOROLAC TROMETHAMINE 30 MG/ML INJECTION
30 mg/mL (1 mL) | INTRAMUSCULAR | Status: AC
Start: 2015-07-07 — End: 2015-07-07
  Administered 2015-07-07: 16:00:00 via INTRAVENOUS

## 2015-07-07 MED ORDER — IBUPROFEN 600 MG TAB
600 mg | ORAL_TABLET | Freq: Four times a day (QID) | ORAL | Status: DC | PRN
Start: 2015-07-07 — End: 2015-07-07

## 2015-07-07 MED ORDER — HYDROMORPHONE 2 MG TAB
2 mg | Freq: Once | ORAL | Status: AC
Start: 2015-07-07 — End: 2015-07-07
  Administered 2015-07-07: 19:00:00 via ORAL

## 2015-07-07 MED FILL — DOCUSATE SODIUM 100 MG CAP: 100 mg | ORAL | Qty: 1

## 2015-07-07 MED FILL — HYDRALAZINE 10 MG TAB: 10 mg | ORAL | Qty: 1

## 2015-07-07 MED FILL — MIDAZOLAM 1 MG/ML IJ SOLN: 1 mg/mL | INTRAMUSCULAR | Qty: 2

## 2015-07-07 MED FILL — METOPROLOL SUCCINATE SR 50 MG 24 HR TAB: 50 mg | ORAL | Qty: 2

## 2015-07-07 MED FILL — HYDROMORPHONE 30 MG/30 ML (1 MG/ML) IN 0.9% NACL INFUSION: 30 mg/ mL | INTRAVENOUS | Qty: 30

## 2015-07-07 MED FILL — HYDROMORPHONE 2 MG TAB: 2 mg | ORAL | Qty: 1

## 2015-07-07 MED FILL — SPIRONOLACTONE 25 MG TAB: 25 mg | ORAL | Qty: 2

## 2015-07-07 MED FILL — VISIPAQUE 320 MG IODINE/ML INTRAVENOUS SOLUTION: 320 mg iodine/mL | INTRAVENOUS | Qty: 200

## 2015-07-07 MED FILL — PANTOPRAZOLE 40 MG TAB, DELAYED RELEASE: 40 mg | ORAL | Qty: 1

## 2015-07-07 MED FILL — ONDANSETRON HCL 4 MG TAB: 4 mg | ORAL | Qty: 1

## 2015-07-07 MED FILL — POLYETHYLENE GLYCOL 3350 17 GRAM (100 %) ORAL POWDER PACKET: 17 gram | ORAL | Qty: 1

## 2015-07-07 MED FILL — ZOLPIDEM 5 MG TAB: 5 mg | ORAL | Qty: 1

## 2015-07-07 MED FILL — OXYCODONE-ACETAMINOPHEN 5 MG-325 MG TAB: 5-325 mg | ORAL | Qty: 2

## 2015-07-07 MED FILL — KETOROLAC TROMETHAMINE 30 MG/ML INJECTION: 30 mg/mL (1 mL) | INTRAMUSCULAR | Qty: 1

## 2015-07-07 NOTE — Other (Signed)
Soap enema administered.  Pt tolerated well

## 2015-07-07 NOTE — Other (Signed)
7ml of dilaudid wasted witnessed by [employer].

## 2015-07-07 NOTE — Other (Signed)
Interdisciplinary Round Note   Patient Information: April Perry                                      469/62 Reason for Admission: Fibroid, uterine [D25.9]     Attending Provider:   Kathlen Mody, MD  Primary Care Physician:       Georgianne Fick, MD       [phone] Past Medical History:   Past Medical History   Diagnosis Date   ??? Hypertension    ??? Diabetes ([place])    ??? Genital herpes    ??? Ovarian cyst    ??? Obesity    ??? Chronic pain      left arm, chest and back     [facility] day:                           - - -  Estimated discharge date:   RRAT Score: Low Risk            8       Total Score        3 Relationship with PCP    4 More than 1 Admission in calendar year    1 Charlson Comorbidity Score        Criteria that do not apply:    Patient Living Status    Patient Length of Stay > 5    Patient Insurance is Medicare, Medicaid or Self Pay     Goals for Today no nausea or pain      Overnight Events: n/a     IV Antibiotics? n/a       When started: n/a   VTE Prophylaxis               Sequential Compression Device: Bilateral                 Lines, Drains, & Airways  Peripheral IV 07/06/15 Right Antecubital-Site Assessment: Clean, dry, & intact  [REMOVED] Sheath 07/06/15-Site Assessment: Clean, dry, & intact  [REMOVED] Sheath 07/06/15-Site Assessment: Clean, dry, & intact  [REMOVED] Urinary Catheter 07/06/15 Foley-Criteria for Appropriate Use: Surgical procedure                    Intake and Output:   07/19 1901 - 07/21 0700  In: 325 [P.O.:325]  Out: 1150 [Urine:1150]  07/21 0701 - 07/21 1900  In: 0   Out: 400 [Urine:400]    Last Bowel Movement Date: 07/05/15                 Current Diet: DIET REGULAR       Abdominal   Abdominal Assessment: Intact, Constipation (on medication)  Nutrition  Chewing/Swallowing Problems: No  Difficulty with Secretions: No  Speech Slurred/Thick/Garbled: No   Recent Glucose Results:   Lab Results   Component Value Date/Time    GLU 118* 07/07/2015 02:20 AM     GI Prophylaxis: no        Type: n/a        Activity Level:  Activity Level: Head of bed elevated (degrees)    Needs assistance with ADLs: no  PT Consult Status: n/a Current Immunizations:    There is no immunization history on file for this patient.   Recommendations:       Discharge Disposition: Home Independent  Needs for Discharge: n/a           Recommendations from IDR team:n/a  Other Notes: n/a

## 2015-07-07 NOTE — Discharge Summary (Signed)
[facility]                               [place], [place] [address]                                 DISCHARGE SUMMARY    PATIENT:   April Perry, April Perry  MRN:           [id number]    ADMIT DATE:    07/06/2015  BILLING:       [id number] [facility] DATE:     07/07/2015  ATTENDING: Kathlen Mody, MD  DICTATING  Eden Emms, MD      SUMMARY: The patient was admitted under observation status on 07/06/2015.  Planned discharge is 07/07/2015.    DISCHARGE DIAGNOSES  1. Uterine fibroids, status post uterine artery embolization.  2. Hypertension.  3. Diabetes mellitus, diet-controlled.  4. Iron-deficiency anemia.  5. Right renal cyst.    SERVICE: The patient was admitted on the hospitalist  service. Interventional Radiology following.    PROCEDURES: On 07/06/2015, the patient underwent uterine artery  embolization.    HISTORY OF PRESENT ILLNESS: Please refer to the admission H and P.    Briefly, the patient is a 44 year old female with a past history  significant for the above, who went for uterine artery embolization. We  were asked to evaluate the patient and observe her overnight. She was on  Dilaudid PCA, which was subsequently discontinued.    HOSPITAL COURSE BY PROBLEM  1. Uterine fibroids: Status post uterine artery embolization. Site  was dressed, clean, dry and intact. She continues to complain of some pain.  She was trialed on p.o. Percocet and weaned off the Dilaudid PCA. The  Percocet did not seem to be affecting her pain control at all. I have given  her a dose of Dilaudid p.o. at 2 mg. If this works for her, we will plan to  discharge the patient with the same. Case was discussed with the  Interventional Radiology PA, as well, and they will be writing for  ibuprofen to be taken around the clock, as well. On other issues, the lab  work overnight has been stable, with a hemoglobin and hematocrit of  10.6/34.2.   2. Hypertension: Pressures have been elevated. I believe that there is  likely a pain component to this. She has been maintained on her outpatient  medications, which can be continued and followed up on an outpatient  basis.  3. Diabetes mellitus: Diet-controlled. No acute issues regarding this  diagnosis.  4. Iron-deficiency anemia: By history. No acute issues. Hemoglobin and  hematocrit have been stable.  5. Right renal cyst: By history. No acute issues.    PHYSICAL EXAMINATION: On examination today, vital signs are stable and  normal, except for an elevated pressure of 175/92. The patient appears to  be in mild distress and is complaining of some lower abdominal pain. She  also has had a little bit of difficulty voiding, and had to strain a little  after her Foley was removed. She otherwise denies any fever, chills,  nausea, vomiting, or any chest pain, shortness of breath, or cough. On  examination, she has a regular heart, clear  lungs, benign abdomen, with  some mild to moderate tenderness to palpation in the lower abdomen.  The entry site for her procedure is dressed, clean, dry and intact.    LABORATORY DATA: Labs today include a normal CBC and metabolic panel with H  and H, however, of 10.6/34.2, stable.    DISPOSITION: Plan to discharge home, so long as her pain is controlled and  she is ambulating and voiding without difficulty.    MEDICATIONS ON DISCHARGE  1. The planned medicines are as follows:  a. Colace 100 mg p.o. b.i.d.  b. Dilaudid 2 mg p.o. every 6 hours p.r.n. pain.  c. Ibuprofen 600 mg p.o. every 6 hours p.r.n. pain.  d. Zofran 4 mg p.o. every 8 hours p.r.n. nausea.  2. Otherwise, resume the following:  a. Toprol-XL 100 mg p.o. daily.  b. Protonix 40 mg p.o. daily.  c. MiraLax 17 g p.o. daily.  d. Aldactone 50 mg p.o. daily.    FOLLOWUP  1. With her PCP, Dr. Cecile Sheerer, in 7-10 days.  2. With Interventional Radiology as scheduled.    Total time of discharge thus far is greater than 30 minutes.                      Eden Emms, MD    PDP:wmx  D: 07/07/2015 T: 07/07/2015 11:27 P  Job: [id number]  CScriptDoc #: [id number]  cc:   Kathlen Mody, MD        Cathren Laine, MD        Eden Emms, MD        Noralyn Pick, MD

## 2015-07-07 NOTE — Discharge Summary (Addendum)
Discharge summary dictated.  ID#: [id number].    Called back floor. Pt wants to go home. Pain better controlled with Dilaudid PO. Voided without difficulty. Mother here with pt. As d/w nursing.   Eden Emms, MD  07/07/2015  5:33 PM

## 2015-07-07 NOTE — Progress Notes (Addendum)
0010 Patient alert, NAD, BP elevated 187/98, HR 82, denies SOB, no chest pain, asymptomatic at this time, bp med given, and tolerated well. In stable condition.   0120 Patient alert, NAD, in stable, vitals below: asymptomatic, voiced no c/o at this time. Will continue to monitor.   Visit Vitals   Item Reading   ??? BP 177/94 mmHg   ??? Pulse 78   ??? Temp 97.9 ??F (36.6 ??C)   ??? Resp 18   ??? Ht 5\' 7"  (1.702 m)   ??? Wt 74.39 kg (164 lb)   ??? BMI 25.68 kg/m2   ??? SpO2 98%   ??? Breastfeeding No   0432 Pt alert, NAD, denies HA, no chest pain, no SOB, BP has been decreasing since med given, asymptomatic, voice no c/o at this time. V/S below:   Visit Vitals   Item Reading   ??? BP 172/70 mmHg   ??? Pulse 82   ??? Temp 97.6 ??F (36.4 ??C)   ??? Resp 16   ??? Ht 5\' 7"  (1.702 m)   ??? Wt 74.39 kg (164 lb)   ??? BMI 25.68 kg/m2   ??? SpO2 97%   ??? Breastfeeding No   0620 Pt alert, voice no c/o at this time, PRN bp med given, pt tolerated well. In stable condition.

## 2015-07-07 NOTE — Other (Addendum)
Pt refused PRN blood pressure meds.  1710: Pt called and accepted her medication after calling her pharmacy.

## 2015-07-07 NOTE — Other (Signed)
Discharge instructions provided to patient, ample time provided for questions.  Patient verbalized understanding.  No concerns at this time. Patient stable for discharge.

## 2015-07-07 NOTE — Progress Notes (Signed)
Progress Note      Patient: April Perry               Sex: female          DOA: 07/06/2015         Date of Birth:  [birth year]      Age:  44 y.o.        LOS:                Subjective:     C/O lower pelvic pain N/V unable to get up without vomiting. Tried to pee last night had to strain a little and that caused some lower pelvic pain. Patient having difficulty with tolerating diet without N/V. No fever chills    Objective:      Visit Vitals   Item Reading   ??? BP 182/97 mmHg   ??? Pulse 69   ??? Temp 98.3 ??F (36.8 ??C)   ??? Resp 18   ??? Ht 5\' 7"  (1.702 m)   ??? Wt 164 lb   ??? BMI 25.68 kg/m2   ??? SpO2 96%   ??? Breastfeeding No       Physical Exam:  A&OX3 NAD  Resp CTAB  Card RRR  Abd soft TTP pelvis + bowel sounds  Groin CDI       Intake and Output:  Current Shift:  07/21 0701 - 07/21 1900  In: 0   Out: 200 [Urine:200]  Last three shifts:  07/19 1901 - 07/21 0700  In: 325 [P.O.:325]  Out: 1150 [Urine:1150]    Lab/Data Reviewed:  All lab results for the last 24 hours reviewed.    Medications Reviewed    Continued hospitalization is indicated due to Pain N/V may be able to be D/C's this afternoon pending results of current changes      Assessment/Plan     Active Problems:    * No active hospital problems. *      S/P UFE    D/C PCA pump place on PO pain meds  1 dose of Toradol now than PO Mortrin   PO zofran for N/V  Cont to watch abd pain/cramping    If patient improves this afternoon will consider D/C if patient slow to improve will watch patient another night    Will re-eval this afternoon for disposition

## 2015-07-07 NOTE — Progress Notes (Signed)
Interventional Radiology      Patient still having abd pain that wasn't controlled with Percocet. Hospitalist will try different pain meds if pain controlled on  Dilaudid ok to D/C from IR standpoint if medically cleared by IM appreciate their assistance     Encourage ambulation and antiinflammatories         Dona Walby C Luvern Mischke, PA-C

## 2015-07-07 NOTE — Progress Notes (Addendum)
Bedside and Verbal shift change report given to  Webb Silversmith, RN (oncoming nurse) by Gerhard Munch, RN (offgoing nurse). Report included the following information SBAR, Kardex, MAR and Recent Results.    SITUATION:   ? Code Status: Full Code  ? Reason for Admission: Fibroid, uterine [D25.9]    ? Hospital day:   ? Problem List:       Hospital Problems  Date Reviewed: 05/05/2015    None          BACKGROUND:    Past Medical History:   Past Medical History   Diagnosis Date   ??? Hypertension    ??? Diabetes ([place])    ??? Genital herpes    ??? Ovarian cyst    ??? Obesity    ??? Chronic pain      left arm, chest and back         Patient taking anticoagulants no     ASSESSMENT:   ? Changes in Assessment Throughout Shift: elevated BP, med given    ? Patient has Central Line: no Reasons if yes:     ? Patient has Foley Cath: no Reasons if yes:   ?      ? Last Vitals:     Filed Vitals:    07/07/15 0432   BP: 172/70   Pulse: 82   Temp: 97.6 ??F (36.4 ??C)   Resp: 16   Height:    Weight:    SpO2: 97%   LMP: 06/27/2015       ? IV and DRAINS (will only show if present)   Peripheral IV 07/06/15 Right Antecubital-Site Assessment: Clean, dry, & intact  [REMOVED] Sheath 07/06/15-Site Assessment: Clean, dry, & intact  [REMOVED] Sheath 07/06/15-Site Assessment: Clean, dry, & intact    ? WOUND (if present)   Wound Type:  incisional   Dressing present  yes   Wound Concerns/Notes:  none    ? PAIN    Pain Assessment    Pain Intensity 1: 0 (07/07/15 0344)    Pain Location 1: Abdomen    Pain Intervention(s) 1: Medication (see MAR)    Patient Stated Pain Goal: 0  o Interventions for Pain:  medication  o Intervention effective: yes  o Time of last intervention: see mar   o Reassessment Completed: yes     ? Last 3 Weights:  Last 3 Recorded Weights in this Encounter    07/06/15 1126   Weight: 74.39 kg (164 lb)     Weight change:     ? INTAKE/OUPUT    Current Shift: 07/20 1901 - 07/21 0700  In: 25 [P.O.:25]  Out: 800 [Urine:800]     Last three shifts: 07/19 0701 - 07/20 1900  In: 0   Out: 350 [Urine:350]    ? LAB RESULTS     Recent Labs      07/07/15   0220  07/06/15   1140   WBC  12.4  5.1   HGB  10.6*  10.6*   HCT  34.2*  34.1*   PLT  489*  426*        Recent Labs      07/07/15   0220  07/06/15   1140   NA  138  141   K  3.9  4.2   GLU  118*  86   BUN  7  9   CREA  0.78  0.88   CA  8.5  9.1   MG  2.0   --  INR  1.2  1.0       RECOMMENDATIONS AND DISCHARGE PLANNING     1. Pending tests/procedures/ Plan of Care or Other Needs: pain management, monitor BP     2. Discharge plan for patient and Needs/Barriers:     3. Estimated Discharge Date: 07/08/15 Posted on Whiteboard in Patient???s Room: yes      4. The patient's care plan was reviewed with the oncoming nurse.       "HEALS" SAFETY CHECK      Fall Risk    Total Score: 1    Safety Measures: Safety Measures: Bed/Chair-Wheels locked, Bed in low position, Call light within reach, Fall prevention (comment), Family at bedside, Gripper socks    A safety check occurred in the patient's room between off going nurse and oncoming nurse listed above.    The safety check included the below items  Area Items   H  High Alert Medications ? Verify all high alert medication drips (heparin, PCA, etc.)   E  Equipment ? Suction is set up for ALL patients (with yanker)  ? Red plugs utilized for all equipment (IV pumps, etc.)  ? WOW???s wiped down at end of shift.  ? Room stocked with oxygen, suction, and other unit-specific supplies   A  Alarms ? Bed alarm is set for fall risk patients  ? Ensure chair alarm is in place and activated if patient is up in a chair   L  Lines ? Check IV for any infiltration  ? Foley bag is empty if patient has a Foley   ? Tubing and IV bags are labeled   S  Safety   ? Room is clean, patient is clean, and equipment is clean.  ? Hallways are clear from equipment besides carts.   ? Fall bracelet on for fall risk patients  ? Ensure room is clear and free of clutter   ? Suction is set up for ALL patients (with yanker)  ? Hallways are clear from equipment besides carts.   ? Isolation precautions followed, supplies available outside room, sign posted     Gerhard Munch, RN

## 2015-11-07 ENCOUNTER — Ambulatory Visit: Payer: Self-pay | Primary: Family Medicine

## 2016-12-20 ENCOUNTER — Emergency Department: Payer: Self-pay

## 2016-12-20 ENCOUNTER — Emergency Department
Admission: EM | Admit: 2016-12-20 | Discharge: 2016-12-20 | Disposition: A | Discharge status: Home / Self Care | Payer: Self-pay | Attending: Emergency Medicine | Admitting: Emergency Medicine

## 2016-12-20 ENCOUNTER — Encounter: Payer: Self-pay | Admitting: Emergency Medicine

## 2016-12-20 DIAGNOSIS — Y939 Activity, unspecified: Secondary | ICD-10-CM | POA: Insufficient documentation

## 2016-12-20 DIAGNOSIS — Y999 Unspecified external cause status: Secondary | ICD-10-CM | POA: Insufficient documentation

## 2016-12-20 DIAGNOSIS — I10 Essential (primary) hypertension: Secondary | ICD-10-CM | POA: Insufficient documentation

## 2016-12-20 DIAGNOSIS — Y929 Unspecified place or not applicable: Secondary | ICD-10-CM | POA: Insufficient documentation

## 2016-12-20 DIAGNOSIS — S2231XA Fracture of one rib, right side, initial encounter for closed fracture: Secondary | ICD-10-CM | POA: Insufficient documentation

## 2016-12-20 DIAGNOSIS — F17210 Nicotine dependence, cigarettes, uncomplicated: Secondary | ICD-10-CM | POA: Insufficient documentation

## 2016-12-20 HISTORY — DX: Essential (primary) hypertension: I10

## 2016-12-20 MED ORDER — TRAMADOL HCL 50 MG PO TABS: 50.0000 mg | ORAL_TABLET | Freq: Four times a day (QID) | ORAL | 0 refills | Status: AC | PRN

## 2016-12-20 NOTE — ED Triage Notes (Signed)
Patient presents to the ED with right rib tenderness under right breast x 3 days.  Patient is in no obvious distress at this time.  Denies shortness of breath.  Denies deformity.  Patient states she fell about 2 weeks ago but this area wasn't originally hurting after the fall.

## 2016-12-20 NOTE — ED Provider Notes (Signed)
[facility] Emergency Department Provider Note   ____________________________________________   First MD Initiated Contact with Patient 12/20/16 1421     (approximate)  I have reviewed the triage vital signs and the nursing notes.   HISTORY  Chief Complaint Chest Pain    HPI Misty Larson is a 46 y.o. female patient complain of right rib tenderness underneath her right breast for 3 days. Patient state 2 weeks ago she's missed a step when departing a tractor-trailer slipped and fell rolling down a slight incline. Patient state pain has been tolerable until 3 days ago. She denies any other provocative incident for her complaint. Patient denies any dyspnea.Patient rates the pain as a 4/10. No palliative measures for this complaint.   Past Medical History:  Diagnosis Date  . Hypertension     There are no active problems to display for this patient.   Past Surgical History:  Procedure Laterality Date  . uterine ablasion      Prior to Admission medications   Medication Sig Start Date End Date Taking? Authorizing Provider  traMADol (ULTRAM) 50 MG tablet Take 1 tablet (50 mg total) by mouth every 6 (six) hours as needed. 12/20/16 12/20/17  Joni Reining, PA-C    Allergies   No family history on file.  Social History Social History  Substance Use Topics  . Smoking status: Current Every Day Smoker    Types: Cigarettes  . Smokeless tobacco: Never Used  . Alcohol use No    Review of Systems Constitutional: No fever/chills Eyes: No visual changes. ENT: No sore throat. Cardiovascular: Denies chest pain. Respiratory: Denies shortness of breath. Gastrointestinal: No abdominal pain.  No nausea, no vomiting.  No diarrhea.  No constipation. Genitourinary: Negative for dysuria. Musculoskeletal: Negative for back pain. Skin: Negative for rash. Neurological: Negative for headaches, focal weakness or  numbness. Endocrine:Hypertension  ____________________________________________   PHYSICAL EXAM:  VITAL SIGNS: ED Triage Vitals  Enc Vitals Group     BP 12/20/16 1333 (!) 145/82     Pulse Rate 12/20/16 1333 72     Resp 12/20/16 1333 18     Temp 12/20/16 1333 97.8 F (36.6 C)     Temp Source 12/20/16 1333 Oral     SpO2 12/20/16 1333 97 %     Weight 12/20/16 1331 185 lb (83.9 kg)     Height 12/20/16 1331 5\' 7"  (1.702 m)     Head Circumference --      Peak Flow --      Pain Score --      Pain Loc --      Pain Edu? --      Excl. in GC? --     Constitutional: Alert and oriented. Well appearing and in no acute distress. Eyes: Conjunctivae are normal. PERRL. EOMI. Head: Atraumatic. Nose: No congestion/rhinnorhea. Mouth/Throat: Mucous membranes are moist.  Oropharynx non-erythematous. Neck: No stridor.  No cervical spine tenderness to palpation. Hematological/Lymphatic/Immunilogical: No cervical lymphadenopathy. Cardiovascular: Normal rate, regular rhythm. Grossly normal heart sounds.  Good peripheral circulation. Respiratory: Normal respiratory effort.  No retractions. Lungs CTAB. Gastrointestinal: Soft and nontender. No distention. No abdominal bruits. No CVA tenderness. Musculoskeletal: No chest wall deformity. Equal chest wall expansion with deep inspiration. No complaint of pain with deep inspiration.  Neurologic:  Normal speech and language. No gross focal neurologic deficits are appreciated. No gait instability. Skin:  Skin is warm, dry and intact. No rash noted. Psychiatric: Mood and affect are normal. Speech and behavior are  normal.  ____________________________________________   LABS (all labs ordered are listed, but only abnormal results are displayed)  Labs Reviewed - No data to display ____________________________________________  EKG   ____________________________________________  RADIOLOGY  X-rays reveal a nondisplaced fracture of the 10th right rib.  ____________________________________________   PROCEDURES  Procedure(s) performed: None  Procedures  Critical Care performed: No  ____________________________________________   INITIAL IMPRESSION / ASSESSMENT AND PLAN / ED COURSE  Pertinent labs & imaging results that were available during my care of the patient were reviewed by me and considered in my medical decision making (see chart for details).  Right rib fracture. Patient given discharge care instructions. Patient given prescription for tramadol to take as needed for pain. Patient advised have reviewed the x-ray in 3 weeks. Return by ER if condition worsens.  Clinical Course      ____________________________________________   FINAL CLINICAL IMPRESSION(S) / ED DIAGNOSES  Final diagnoses:  Closed fracture of one rib of right side, initial encounter      NEW MEDICATIONS STARTED DURING THIS VISIT:  Discharge Medication List as of 12/20/2016  3:23 PM    START taking these medications   Details  traMADol (ULTRAM) 50 MG tablet Take 1 tablet (50 mg total) by mouth every 6 (six) hours as needed., Starting Thu 12/20/2016, Until Fri 12/20/2017, Print         Note:  This document was prepared using Dragon voice recognition software and may include unintentional dictation errors.    Joni Reiningonald K Zackeriah Kissler, PA-C 12/20/16 1832    Emily FilbertJonathan E Williams, MD 12/21/16 [phone]

## 2016-12-20 NOTE — Discharge Instructions (Signed)
Recommend follow-up x-ray in 2-3 weeks.

## 2017-01-02 ENCOUNTER — Encounter: Payer: Self-pay | Admitting: Obstetrics and Gynecology

## 2017-03-20 ENCOUNTER — Encounter: Payer: Self-pay | Admitting: Emergency Medicine

## 2017-03-20 ENCOUNTER — Ambulatory Visit
Admission: EM | Admit: 2017-03-20 | Discharge: 2017-03-20 | Disposition: A | Discharge status: Home / Self Care | Payer: Self-pay | Attending: Family Medicine | Admitting: Family Medicine

## 2017-03-20 DIAGNOSIS — Z760 Encounter for issue of repeat prescription: Secondary | ICD-10-CM

## 2017-03-20 DIAGNOSIS — I10 Essential (primary) hypertension: Secondary | ICD-10-CM

## 2017-03-20 HISTORY — DX: Type 2 diabetes mellitus without complications: E11.9

## 2017-03-20 LAB — BASIC METABOLIC PANEL
Anion gap: 4 — ABNORMAL LOW (ref 5–15)
BUN: 7 mg/dL (ref 6–20)
CALCIUM: 9.4 mg/dL (ref 8.9–10.3)
CO2: 24 mmol/L (ref 22–32)
Chloride: 108 mmol/L (ref 101–111)
Creatinine, Ser: 0.74 mg/dL (ref 0.44–1.00)
GFR calc non Af Amer: >60 mL/min (ref >60)
Glucose, Bld: 127 mg/dL — ABNORMAL HIGH (ref 65–99)
Potassium: 3.9 mmol/L (ref 3.5–5.1)
SODIUM: 136 mmol/L (ref 135–145)

## 2017-03-20 MED ORDER — SPIRONOLACTONE 50 MG PO TABS: 50.0000 mg | ORAL_TABLET | Freq: Every day | ORAL | 0 refills | Status: AC

## 2017-03-20 MED ORDER — METOPROLOL SUCCINATE ER 200 MG PO TB24: 200.0000 mg | ORAL_TABLET | Freq: Every day | ORAL | 0 refills | Status: AC

## 2017-03-20 MED ORDER — AMLODIPINE BESYLATE 5 MG PO TABS: 5.0000 mg | ORAL_TABLET | Freq: Every day | ORAL | 0 refills | Status: AC

## 2017-03-20 NOTE — ED Provider Notes (Signed)
[facility] ____________________________________________  Time seen: Approximately 10:25 AM  I have reviewed the triage vital signs and the nursing notes.   HISTORY  Chief Complaint Medication Refill   HPI Misty Larson is a 46 y.o. female presenting for request of medication refill of her daily blood pressure medications. Patient reports that she has been on the same medications unchanged for several years. Patient reports that she has had high blood pressure for approximately 15 years. Patient states that her last dose of her medications were yesterday and has not missed any dosing, except for today. Patient reports she is here at the urgent care requesting medication refill as she recently missed her benefit enrollment for work and does not currently have insurance. Patient reports that she does have a primary care physician in [place], but thought that since she does not currently have insurance she could not see her primary physician. Patient states that she feels well at this time and states that she is here only for medication refill. Denies any recent sickness. Denies history of renal insufficiency. Patient reports that she did have one episode of anemia in the past due to a bleeding fibroid that was removed and no other reoccurring symptoms. Denies any recent sickness. Patient reports that she did eat just prior to arrival.  Denies chest pain, shortness of breath, abdominal pain, dysuria, extremity pain, extremity swelling or rash. Denies recent sickness. Denies recent antibiotic use.   Patient's last menstrual period was 02/23/2017 (exact date). Denies pregnancy. Reports tubal ligation.   Past Medical History:  Diagnosis Date  . Diabetes mellitus without complication (HCC)   . Hypertension   States monitoring diabetes with diet, not on diabetic medications.  There are no active problems to display for this patient.   Past Surgical History:  Procedure  Laterality Date  . uterine ablasion       No current facility-administered medications for this encounter.   Current Outpatient Prescriptions:  .  amLODipine (NORVASC) 5 MG tablet, Take 5 mg by mouth daily., Disp: , Rfl:  .  metoprolol (TOPROL-XL) 200 MG 24 hr tablet, Take 200 mg by mouth daily., Disp: , Rfl:  .  spironolactone (ALDACTONE) 50 MG tablet, Take 50 mg by mouth daily., Disp: , Rfl:  .  amLODipine (NORVASC) 5 MG tablet, Take 1 tablet (5 mg total) by mouth daily., Disp: 14 tablet, Rfl: 0 .  metoprolol (TOPROL XL) 200 MG 24 hr tablet, Take 1 tablet (200 mg total) by mouth daily., Disp: 14 tablet, Rfl: 0 .  spironolactone (ALDACTONE) 50 MG tablet, Take 1 tablet (50 mg total) by mouth daily., Disp: 14 tablet, Rfl: 0 .  traMADol (ULTRAM) 50 MG tablet, Take 1 tablet (50 mg total) by mouth every 6 (six) hours as needed., Disp: 20 tablet, Rfl: 0  Allergies Demerol [meperidine]  History reviewed. No pertinent family history.  Social History Social History  Substance Use Topics  . Smoking status: Never Smoker  . Smokeless tobacco: Never Used  . Alcohol use No    Review of Systems Constitutional: No fever/chills Eyes: No visual changes. ENT: No sore throat. Cardiovascular: Denies chest pain. Respiratory: Denies shortness of breath. Gastrointestinal: No abdominal pain.  No nausea, no vomiting.  No diarrhea.  No constipation. Genitourinary: Negative for dysuria. Musculoskeletal: Negative for back pain. Skin: Negative for rash. Neurological: Negative for headaches, focal weakness or numbness.   ____________________________________________   PHYSICAL EXAM:  VITAL SIGNS: ED Triage Vitals  Enc Vitals Group  BP 03/20/17 0955 (!) 158/85     Pulse Rate 03/20/17 0955 70     Resp 03/20/17 0955 16     Temp 03/20/17 0955 98 F (36.7 C)     Temp Source 03/20/17 0955 Oral     SpO2 03/20/17 0955 99 %     Weight 03/20/17 0951 187 lb (84.8 kg)     Height 03/20/17 0951   (1.702 m)     Head Circumference --      Peak Flow --      Pain Score 03/20/17 0951 0     Pain Loc --      Pain Edu? --      Excl. in GC? --     Constitutional: Alert and oriented. Well appearing and in no acute distress. Eyes: Conjunctivae are normal. PERRL. EOMI. ENT      Head: Normocephalic and atraumatic.      Mouth/Throat: Mucous membranes are moist.Oropharynx non-erythematous. Cardiovascular: Normal rate, regular rhythm. Grossly normal heart sounds.  Good peripheral circulation. Respiratory: Normal respiratory effort without tachypnea nor retractions. Breath sounds are clear and equal bilaterally. No wheezes, rales, rhonchi. Gastrointestinal: Soft and nontender.  Musculoskeletal:  Steady gait. No midline cervical, thoracic or lumbar tenderness to palpation.           Right lower leg:  No tenderness or edema.      Left lower leg:  No tenderness or edema.  Neurologic:  Normal speech and language.Speech is normal. No gait instability.  Skin:  Skin is warm, dry and intact. No rash noted. Psychiatric: Mood and affect are normal. Speech and behavior are normal. Patient exhibits appropriate insight and judgment   ___________________________________________   LABS (all labs ordered are listed, but only abnormal results are displayed)  Labs Reviewed  BASIC METABOLIC PANEL - Abnormal; Notable for the following:       Result Value   Glucose, Bld 127 (*)    Anion gap 4 (*)    All other components within normal limits    PROCEDURES Procedures   INITIAL IMPRESSION / ASSESSMENT AND PLAN / ED COURSE  Pertinent labs & imaging results that were available during my care of the patient were reviewed by me and considered in my medical decision making (see chart for details).  Very well-appearing patient. No acute distress. Patient presenting for medication refill help her hypertension medications. BMP evaluated. Discussed with patient and encouraged her discuss with her primary care's  office, Dr. Merlinda Frederick in [place], regarding possibly continuing with them as self-pay patient until insurance is able to restart. Patient is very agreeable to this plan and states that she will call today. Also information given to patient regarding good [email or web address] coupons and patient plans to use. Discussed with patient and encouraged her to otherwise establish local PCP and opened or information given. Discussed with patient 2 weeks of medication refill given.Discussed indication, risks and benefits of medications with patient.  Discussed follow up with Primary care physician this week. Discussed follow up and return parameters including no resolution or any worsening concerns. Patient verbalized understanding and agreed to plan.   ____________________________________________   FINAL CLINICAL IMPRESSION(S) / ED DIAGNOSES  Final diagnoses:  Hypertension, unspecified type  Medication refill     Discharge Medication List as of 03/20/2017 11:05 AM    START taking these medications   Details  !! amLODipine (NORVASC) 5 MG tablet Take 1 tablet (5 mg total) by mouth daily., Starting Wed 03/20/2017, Normal    !!  metoprolol (TOPROL XL) 200 MG 24 hr tablet Take 1 tablet (200 mg total) by mouth daily., Starting Wed 03/20/2017, Normal    !! spironolactone (ALDACTONE) 50 MG tablet Take 1 tablet (50 mg total) by mouth daily., Starting Wed 03/20/2017, Normal     !! - Potential duplicate medications found. Please discuss with provider.      Note: This dictation was prepared with Dragon dictation along with smaller phrase technology. Any transcriptional errors that result from this process are unintentional.         Renford Dills, NP 03/20/17 1625

## 2017-03-20 NOTE — Discharge Instructions (Signed)
Take medication as prescribed. Monitor diet. Regular exercise.  Call and touch base with your primary care office as discussed for further follow up and management. Return to Urgent care for new or worsening concerns.

## 2017-03-20 NOTE — ED Triage Notes (Signed)
Patient needs refill on her blood pressure medications (spironolactone, amlodipine, and metoprolol).  Patient states that she does not have insurance or a PCP.

## 2017-10-15 IMAGING — CR DG RIBS W/ CHEST 3+V*R*
4 series · 4 of 4 positions shown · non-contrast
Comparison: None.

CLINICAL DATA: Right anterolateral rib pain after a fall 2 weeks
ago

EXAM:
RIGHT RIBS AND CHEST - 3+ VIEW

[chest pa]
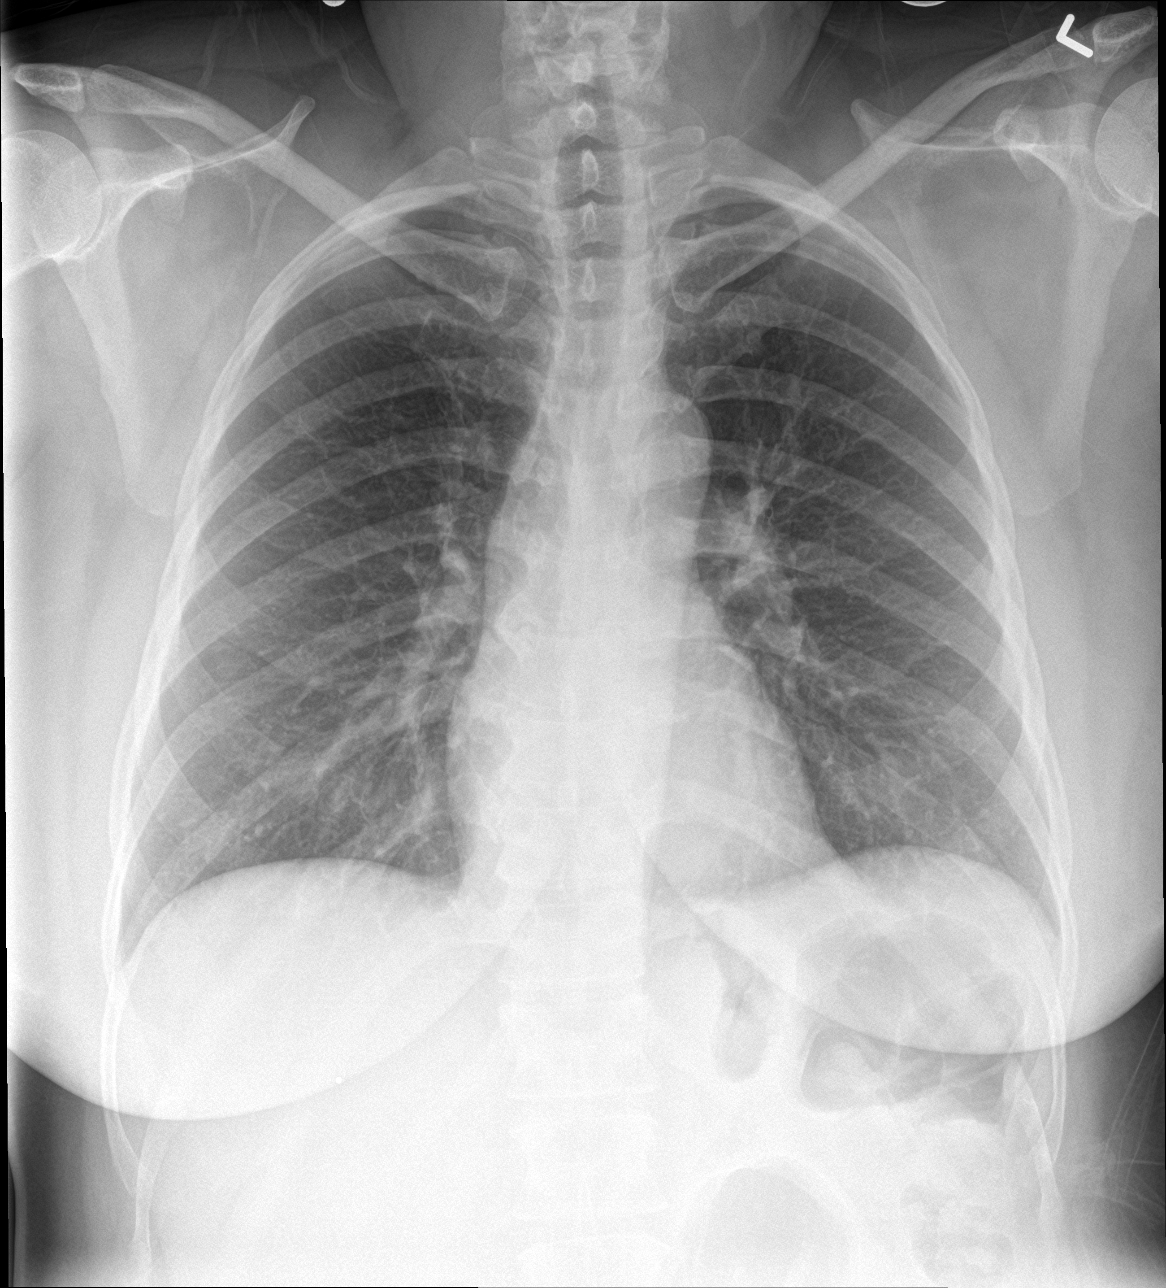

[rib pa]
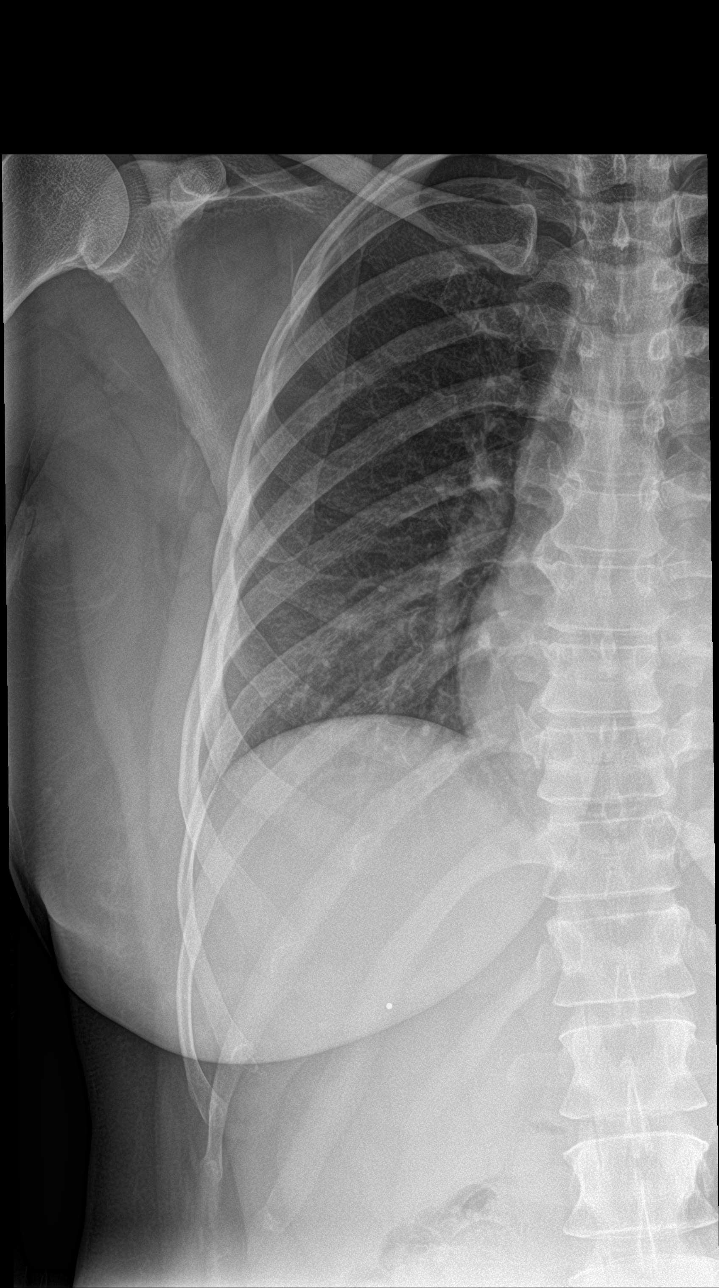

[rib pa obl]
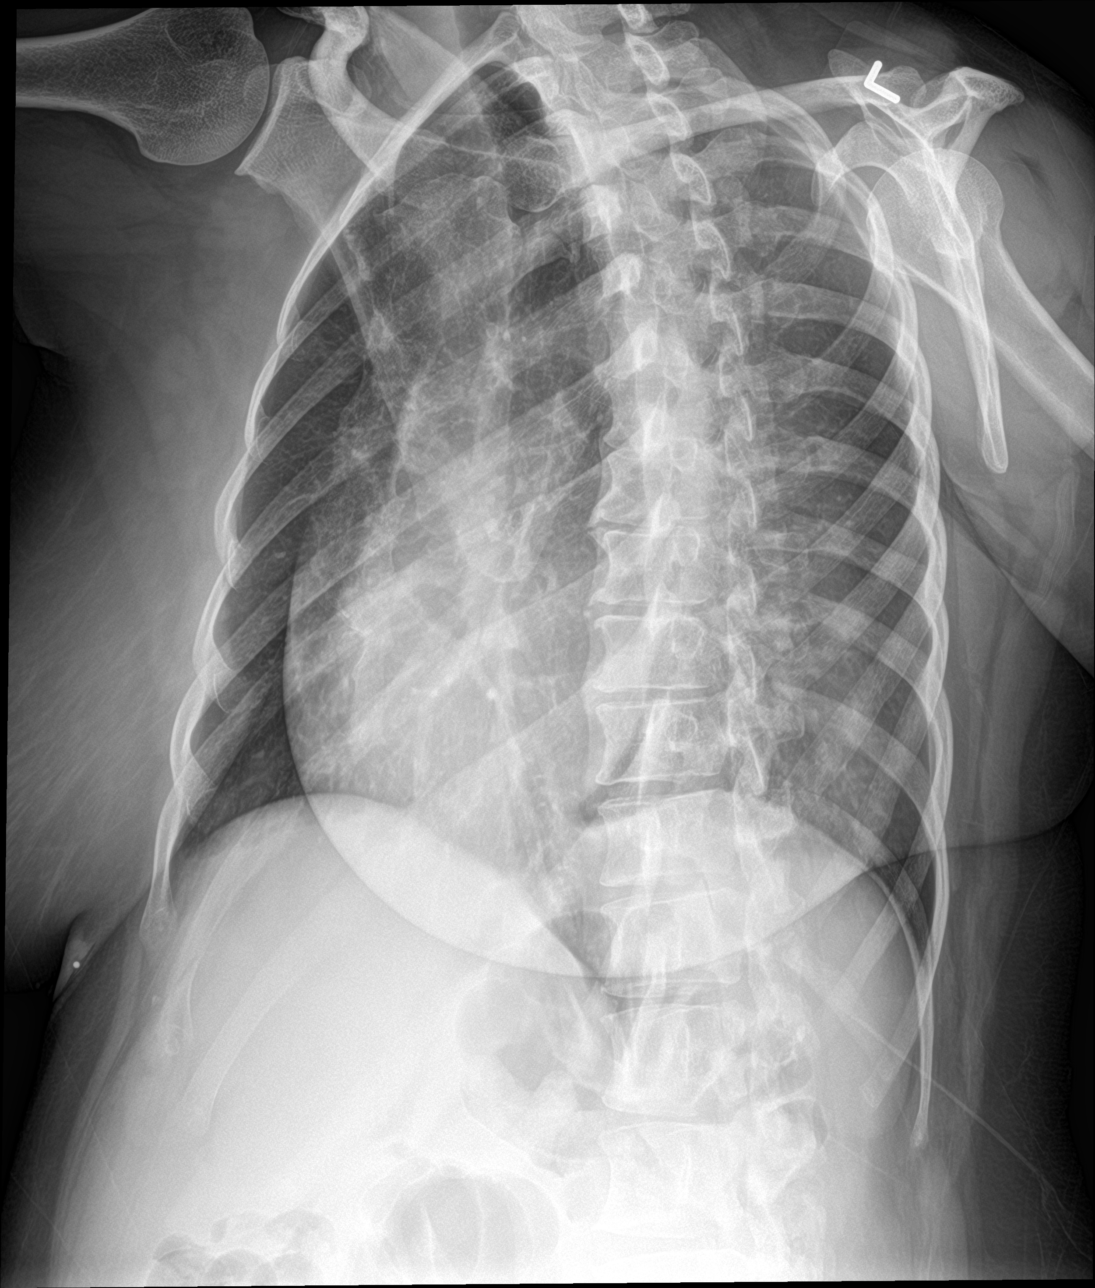

[rib ap]
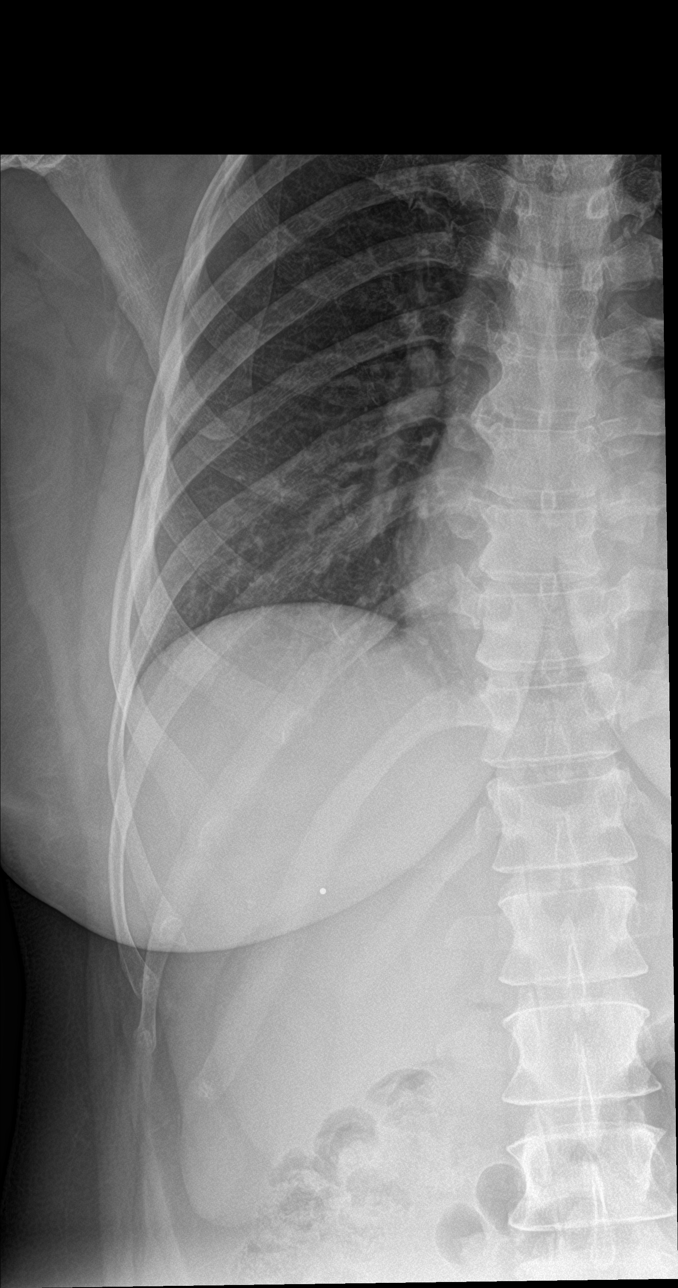

[4 of 4 positions shown; findings below may reference images not displayed]

FINDINGS: No active infiltrate or effusion is seen. Mediastinal and hilar
contours are unremarkable. The heart is within normal limits in
size.

Right rib detail films were obtained with a metallic marker over the
area of pain. On only 1 oblique view, there is a fracture of the
anterior right eighth rib. No other acute rib fracture is seen.
IMPRESSION: 1. Nondisplaced fracture of the anterior right eighth rib.
2. No active lung disease.
